# Patient Record
Sex: Male | Born: 1945 | Race: White | Hispanic: No | Smoking: Former smoker
Health system: Southern US, Community
[De-identification: ages and names within clinical notes are randomized; demographics above are authoritative.]

## PROBLEM LIST (undated history)

## (undated) DIAGNOSIS — F329 Major depressive disorder, single episode, unspecified: Secondary | ICD-10-CM

## (undated) DIAGNOSIS — M1990 Unspecified osteoarthritis, unspecified site: Secondary | ICD-10-CM

## (undated) DIAGNOSIS — F32A Depression, unspecified: Secondary | ICD-10-CM

## (undated) DIAGNOSIS — F419 Anxiety disorder, unspecified: Secondary | ICD-10-CM

## (undated) HISTORY — PX: TOTAL HIP ARTHROPLASTY: SHX124

## (undated) HISTORY — PX: JOINT REPLACEMENT: SHX530

## (undated) HISTORY — PX: OTHER SURGICAL HISTORY: SHX169

## (undated) HISTORY — PX: CORNEAL TRANSPLANT: SHX108

## (undated) HISTORY — PX: EYE SURGERY: SHX253

## (undated) HISTORY — PX: APPENDECTOMY: SHX54

## (undated) HISTORY — DX: Unspecified osteoarthritis, unspecified site: M19.90

---

## 1997-05-18 ENCOUNTER — Encounter: Admission: RE | Admit: 1997-05-18 | Discharge: 1997-05-18 | Payer: Self-pay | Admitting: Family Medicine

## 1997-06-13 ENCOUNTER — Encounter: Admission: RE | Admit: 1997-06-13 | Discharge: 1997-06-13 | Payer: Self-pay | Admitting: Family Medicine

## 1999-02-27 ENCOUNTER — Encounter: Admission: RE | Admit: 1999-02-27 | Discharge: 1999-05-28 | Payer: Self-pay | Admitting: Family Medicine

## 2001-10-01 ENCOUNTER — Ambulatory Visit (HOSPITAL_COMMUNITY): Admission: RE | Admit: 2001-10-01 | Discharge: 2001-10-01 | Payer: Self-pay | Admitting: Gastroenterology

## 2001-10-01 ENCOUNTER — Encounter (INDEPENDENT_AMBULATORY_CARE_PROVIDER_SITE_OTHER): Payer: Self-pay | Admitting: Specialist

## 2002-02-25 ENCOUNTER — Encounter: Payer: Self-pay | Admitting: Orthopedic Surgery

## 2002-03-07 ENCOUNTER — Encounter: Payer: Self-pay | Admitting: Orthopedic Surgery

## 2002-03-07 ENCOUNTER — Inpatient Hospital Stay (HOSPITAL_COMMUNITY): Admission: RE | Admit: 2002-03-07 | Discharge: 2002-03-10 | Payer: Self-pay | Admitting: Orthopedic Surgery

## 2005-05-20 ENCOUNTER — Encounter: Admission: RE | Admit: 2005-05-20 | Discharge: 2005-06-20 | Payer: Self-pay | Admitting: Neurosurgery

## 2006-09-28 ENCOUNTER — Inpatient Hospital Stay (HOSPITAL_COMMUNITY): Admission: RE | Admit: 2006-09-28 | Discharge: 2006-10-01 | Payer: Self-pay | Admitting: Orthopedic Surgery

## 2010-05-21 NOTE — Op Note (Signed)
NAMEGOTHAM, RADEN NO.:  [id number]   MEDICAL RECORD NO.:  [id number]          PATIENT TYPE:  INP   LOCATION:  X002                         FACILITY:  [facility]   PHYSICIAN:  Ollen Gross, M.D.    DATE OF BIRTH:  07/22/1945   DATE OF PROCEDURE:  09/28/2006  DATE OF DISCHARGE:                               OPERATIVE REPORT   PREOPERATIVE DIAGNOSIS:  Osteoarthritis, left hip.   POSTOPERATIVE DIAGNOSIS:  Osteoarthritis, left hip.   PROCEDURE:  Left total hip arthroplasty.   SURGEON:  Ollen Gross, M.D.   ASSISTANT:  Alexzandrew L. Perkins, P.A.C.   ANESTHESIA:  Spinal.   ESTIMATED BLOOD LOSS:  200.   DRAINS:  None.   COMPLICATIONS:  None.   CONDITION:  Stable to the recovery room.   BRIEF CLINICAL NOTE:  Carl Cook is a 65 year old male with severe end-stage  arthritis of the left hip with progressively worsening pain and  dysfunction.  He has had a successful right total hip arthroplasty and  presents now for left total hip arthroplasty.   PROCEDURE IN DETAIL:  After successful administration of spinal  anesthetic, the patient was placed in the right lateral decubitus  position with the left side up and held with the hip positioner.  The  left lower extremity was isolated from his perineum with plastic drapes  and prepped and draped in the usual sterile fashion.   A short posterolateral incision is made with a 10 blade through the  subcutaneous tissue to the level of the fascia lata which is incised in  line with the skin incision.  The sciatic nerve is palpated and  protected and the short external rotators isolated off the femur.  Capsulectomy is performed and the hip is dislocated.  The center of the  femoral head is marked and the trial prosthesis placed such that the  center of the trial head corresponds to the center of his native femoral  head.  Osteotomy line is marked on the femoral neck and osteotomy made  with an oscillating saw.  The femoral  head is removed and the femur  retracted anteriorly to gain acetabular exposure.   The femur is retracted anteriorly and acetabular retractors are placed.  The labrum and osteophytes are then removed.  Acetabular reaming starts  at 45 mm, coursing in increments of 2 to 55 mm, and a 56-mm Pinnacle  acetabular shell is placed in anatomic position and transfixed with two  dome screws.  The apex hole eliminator is placed, and the 40-mm neutral  Ultramet metal liner is placed.  This is a metal-on-metal hip  replacement.   The femur is prepared with the canal finder and irrigation.  Axial  reaming is performed up to 15.5 mg, proximal reaming to 20 D, and the  sleeve machined to a large.  20 D large trial sleeve is placed with a 20  x 15 stem and a 36 + 8 neck 10 degrees beyond his native anteversion.  A  40 + 0 trial head is placed, but this reduced too easily so we went to  40 +  6 which had more appropriate soft tissue tension.  He had great  stability with full extension, full external rotation, 70 degrees  flexion, 40 degrees adduction, 90 degrees internal rotation, 90 degrees  of flexion, 70 degrees of internal rotation.  By placing the left leg on  top of the right, the leg lengths felt equal.  The hip is then  dislocated and trials removed.  Permanent 20 D large sleeve is placed  with 20 x 15 stem, 36 + 8 neck 10 degrees beyond native anteversion.  40  + 6 head is placed and the hip is reduced with the same stability  parameters.  The wound is copiously irrigated with saline solution and  short rotators reattached to the femur through drill holes.  The fascia  lata is closed with interrupted #1 Vicryl, subcu closed with #1-0 and #2-  0 Vicryl and subcuticular running 4-0 Monocryl.  Incision is cleaned and  dried and Steri-Strips and bulky sterile dressing applied.   He is then awakened and transported to recovery in stable condition.      Ollen Gross, M.D.  Electronically  Signed     FA/MEDQ  D:  09/28/2006  T:  09/28/2006  Job:  [id number]

## 2010-05-21 NOTE — H&P (Signed)
NAMEKINGSON, LOHMEYER NO.:  [id number]   MEDICAL RECORD NO.:  [id number]          PATIENT TYPE:  INP   LOCATION:  1618                         FACILITY:  [facility]   PHYSICIAN:  Ollen Gross, M.D.    DATE OF BIRTH:  1945-12-30   DATE OF ADMISSION:  09/28/2006  DATE OF DISCHARGE:  10/01/2006                              HISTORY & PHYSICAL   DATE OF OFFICE VISIT HISTORY AND PHYSICAL:  September 15, 2006.   CHIEF COMPLAINT:  Left hip pain.   HISTORY OF PRESENT ILLNESS:  The patient is a 65 year old male who has  seen by Dr. Lequita Halt having previously undergone a right total hip and  doing well with that. He had continued problems with the left hip which  had been progressive in nature and had reached a point where he would  like to have the other side done.   ALLERGIES:  No known drug allergies.   CURRENT MEDICATIONS:  Allopurinol300 mg daily.  He stopped his baby  aspirin, glucosamine and fish oil preop.   PAST MEDICAL HISTORY:  Osteoarthritis and gout.   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   SOCIAL HISTORY:  Widower, works as a Advice worker, nonsmoker, two  drinks of alcohol, two children.  Mother will be assisting with care  after surgery.   FAMILY HISTORY:  Father deceased age 4 with a history of heart failure  and bypass. Mother with a history of breast cancer. Mother and brother  have had a hip replacement secondary to osteoarthritis.   REVIEW OF SYSTEMS:  GENERAL:  No fevers, chills or night sweats. NEURO:  No seizures, syncope or paralysis.  RESPIRATORY:  No shortness of  breath, productive cough or hemoptysis.  CARDIOVASCULAR:  No chest pain,  angina or orthopnea. GI: No nausea, vomiting, diarrhea or constipation.  GU:  No dysuria, hematuria or discharge.  MUSCULOSKELETAL:  Left hip.   PHYSICAL EXAMINATION:  VITAL SIGNS:  Pulse 64, respirations 14, blood  pressure 124/78.  GENERAL:  The patient is a 65 year old, white male, well-nourished, well-  developed in no acute distress.  Excellent historian.  HEENT: Normocephalic, atraumatic.  Pupils are round and reactive.  Oropharynx clear.  EOMs intact.  He does wear glasses.  Permanent  crowns, no dentures.  NECK:  Supple.  CHEST:  Clear anterior and posterior chest walls.  No rhonchi, rales or  wheezing.  HEART:  Regular rate and rhythm.  No murmur, S1, S2.  ABDOMEN:  Soft, nontender.  Bowel sounds present.  RECTAL/BREASTS/GENITALIA:  Not done not pertinent to illness.  EXTREMITIES:  Left hip:  The left hip shows flexion to 90 degrees,  zero  internal rotation, 10 degrees external rotation, 10 degrees abduction.   IMPRESSION:  Osteoarthritis left hip.   PLAN:  The patient admitted to [facility] to undergo a left  total hip replacement arthroplasty.  The surgery will be performed by  Dr. Ollen Gross.      Alexzandrew L. Julien Girt, P.A.C.  Ollen Gross, M.D.  Electronically Signed    ALP/MEDQ  D:  10/01/2006  T:  10/02/2006  Job:  [id number]   cc:   Evelena Peat, M.D.

## 2010-05-24 NOTE — Op Note (Signed)
NAME:  SHAHMEER, BUNN                          ACCOUNT NO.:  [id number]   MEDICAL RECORD NO.:  [id number]                   PATIENT TYPE:  INP   LOCATION:  0003                                 FACILITY:  [facility]   PHYSICIAN:  Ollen Gross, M.D.                 DATE OF BIRTH:  1945/01/12   DATE OF PROCEDURE:  03/07/2002  DATE OF DISCHARGE:                                 OPERATIVE REPORT   PREOPERATIVE DIAGNOSIS:  Osteoarthritis, right hip.   POSTOPERATIVE DIAGNOSIS:  Osteoarthritis, right hip.   PROCEDURE:  Right total hip arthroplasty.   SURGEON:  Ollen Gross, M.D.   ASSISTANT:  Alexzandrew L. Julien Girt, P.A.   ANESTHESIA:  Spinal.   ESTIMATED BLOOD LOSS:  150.   DRAINS:  Hemovac x1.   COMPLICATIONS:  None.   CONDITION:  Stable to recovery.   BRIEF CLINICAL NOTE:  The patient is a 65 year old male with severe end-  stage arthritis of the right hip with pain refractory to nonoperative  management.  He presents now for right total hip arthroplasty.   DESCRIPTION OF PROCEDURE:  After the successful administration of a spinal  anesthetic, the patient is placed in the left lateral decubitus position  with the right side up and held with the hip positioner.  The right lower  extremity is isolated from his perineum with plastic drapes and prepped and  draped in the usual sterile fashion.  A mini-posterolateral incision is made  with a 10 blade through subcutaneous tissue to the level of the fascia lata,  which was incised in line with the skin incision.  The sciatic nerve was  palpated and protected and short external rotators isolated off the femur.  Capsulectomy is performed and the hip dislocated.  The center of the femoral  head is marked and the trial prosthesis is placed such that the center of  the trial head corresponds to the center of the native femoral head.  The  osteotomy line is marked on the femoral neck and osteotomy is made with an  oscillating saw.  The  femur is then retracted anteriorly and acetabular  exposure obtained.  We removed the acetabular labrum and then started  reaming at a 47, coursing in increments of two to a 55, and then a 56 mm  Pinnacle acetabular shell is placed in anatomic position and transfixed with  two domed screws.  A trial 36 mm neutral liner is placed.   The femur is prepared first with the canal finder and then irrigation.  Axial reaming is performed to 15.5 mm proximal, reaming to a 70F, and the  sleeve machined to a large.  A 70F large sleeve is placed and a 20 x 15 stem  and a 36 +8 neck.  His anteversion is essentially neutral, so we built in an  additional 10 degrees of anteversion.  The 36 +0 head trial is placed.  The  hip is reduced with outstanding stability, full extension, full external  rotation, 70 degrees flexion, 40 degrees abduction in 90 degrees internal  rotation; and 90 degrees flexion in 70 degrees internal rotation.  All the  trials are then removed and then the apex hole eliminator placed into the  acetabular shell.  A 36 mm neutral Ultramet metal liner is then gently  impacted into the acetabular shell.  The femur is then prepared with the 71F  large permanent sleeve and a 20 x 15 stem with a 36 +8 neck.  Again we  anteverted him 10 degrees beyond his native version.  The permanent 36 +0  head is placed, the hip reduced, and the same stability parameters.  The  wound is copiously irrigated with antibiotic solution and the short rotators  reattached to the femur through drill holes.  The fascia lata is then closed  over a Hemovac drain with interrupted #1 Vicryl, subcu closed with  interrupted 2-0 Vicryl and subcuticular running 4-0 Monocryl.  The incision  is cleaned and dried and Steri-Strips and a bulky sterile dressing applied.  He is then placed into a knee immobilizer, awakened, and transported to  recovery in stable condition.                                               Ollen Gross, M.D.    FA/MEDQ  D:  03/07/2002  T:  03/07/2002  Job:  [id number]

## 2010-05-24 NOTE — Discharge Summary (Signed)
NAME:  Carl [name], BURGO                          ACCOUNT NO.:  [id number]   MEDICAL RECORD NO.:  [id number]                   PATIENT TYPE:  INP   LOCATION:  0460                                 FACILITY:  [facility]   PHYSICIAN:  Ollen Gross, M.D.                 DATE OF BIRTH:  01/01/1946   DATE OF ADMISSION:  03/07/2002  DATE OF DISCHARGE:  03/10/2002                                 DISCHARGE SUMMARY   ADMISSION DIAGNOSES:  1. Right hip osteoarthritis.  2. Gout.   DISCHARGE DIAGNOSES:  1. Osteoarthritis, right hip, status post right total hip replacement     arthroplasty.  2. Gout.  3. Mild postoperative hyponatremia.   PROCEDURE:  The patient was taken to the operating room on March 07, 2002,  and underwent a right total hip arthroplasty.  The surgeon was Dr. Ollen Gross and the assistant was Alexzandrew L. Perkins, P.A.-C.  Spinal  anesthesia.  The blood loss was 150 mL.  A Hemovac drain x1.   CONSULTATIONS:  None.   HISTORY OF PRESENT ILLNESS:  The patient is a 65 year old male who has been  seen by Dr. Lequita Halt for ongoing right hip pain.  He has known end-stage  osteoarthritis.  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  He has reached a  point where he is physically and mentally ready  to proceed with surgery.  The risks and benefits were discussed.  The procedure had been discussed at  length with him, and he has elected to proceed with surgery.   LABORATORY DATA:  CBC on admission:  Hemoglobin 15.1, hematocrit 43.4, white  blood cell count 5.6, red blood cell count 4.58.  Postoperative hemoglobin  13.6, hematocrit 38.3.  Last H&H were 12.6 and 35.1.  The differential on  this admission:  CBC within normal limits.  The PT and PTT on admission 12.4  and  28.0 respectively.  The INR was 0.9.  Serial pro-times were followed.  The last noted PT and INR were 14.4 and 1.1.  The K on admission showed a  minimally low potassium of 3.4.  The potassium came back up at 4.0.  The  glucose was minimally elevated at 135 and remained at 135.  The remaining  chemistry panel all within normal limits.  A follow-up BMET showed a sodium  drop from 140 to 133.  It was stable at 133.  A urinalysis on admission was  negative.  Her blood type was O-positive.  An electrocardiogram dated February 25, 2002, revealed normal sinus rhythm  with occasional premature supraventricular complexes.  No tracings to  compare.  Confirmed by Dr. [place] Bing.  A chest x-ray preoperatively dated February 25, 2002, revealed scarring in  the right upper lobe.  No active disease.  Right hip film showed marked osteoarthritis of the right hip, along with the  pelvis showing marked osteoarthritis of the right hip.  The pelvis and right hip films postoperatively dated March 07, 2002, revealed  satisfactory appearance of the right total hip replacement arthroplasty.   HOSPITAL COURSE:  The patient was admitted to [facility] on March 07, 2002, and taken to the operating room and underwent the above-stated  procedure without complications.  The patient tolerated the procedure well  and was given 24 hours of postoperative IV antibiotics.  Placed on Coumadin  for deep vein thrombosis prophylaxis.  The patient was placed on PCA  analgesia for pain control.  A Hemovac drain placed at the time of surgery  was pulled on postoperative day one.  The patient had a slight temperature  of 101.3 degrees on postoperative day one, treated with antibiotics and  incentive spirometer.  Encouraged mobility.  He did extremely well with  physical therapy, up ambulating already 250 feet on postoperative day one,  and greater than 300 feet by postoperative day three.  A dressing change  initially  on postoperative day two.  The incision was healing well.  The PCA  was discontinued.  The Foley was out.  The IV was discontinued.  The patient  was doing extremely well with pain control, very well with his physical  therapy.   DISPOSITION:  By day three postoperatively he was discharged home, on March 10, 2002.   DISCHARGE MEDICATIONS:  1. Percocet for pain.  2. Robaxin for spasm.  3. Coumadin for deep vein thrombosis prophylaxis.   DIET:  As tolerated.   FOLLOW UP:  Follow up two weeks from surgery.  Call the office for an  appointment at #[phone].   ACTIVITY/INSTRUCTIONS:  Touch-down weightbearing.  Hip precautions.  Home  health PT, home health nursing through [facility].  May start  showering.   CONDITION ON DISCHARGE:  Improved.       Alexzandrew L. Julien Girt, P.A.              Ollen Gross, M.D.    ALP/MEDQ  D:  03/21/2002  T:  03/21/2002  Job:  [id number]

## 2010-05-24 NOTE — Discharge Summary (Signed)
NAMEJUVENCIO, Carl [name] NO.:  [id number]   MEDICAL RECORD NO.:  [id number]          PATIENT TYPE:  INP   LOCATION:  1618                         FACILITY:  [facility]   PHYSICIAN:  Ollen Gross, M.D.    DATE OF BIRTH:  08/12/1945   DATE OF ADMISSION:  09/28/2006  DATE OF DISCHARGE:  10/01/2006                               DISCHARGE SUMMARY   DISCHARGE DIAGNOSIS:  1. Osteoarthritis, left hip.  2. Gout.   DISCHARGE DIAGNOSES:  1. Osteoarthritis left hip, status post left total hip replacement      arthroplasty.  2. Gout.   PROCEDURE:  September 28, 2006, left total hip.  Surgeon: Dr. Lequita Halt.  Assistant: Avel Peace PA-C.  Spinal anesthesia.   CONSULTATIONS:  None.   BRIEF HISTORY:  This is a 65 year old male with significant and severe  end-stage arthritis of left hip with progressive worsening pain and  dysfunction. Underwent successful right total hip, now presents for left  total hip   LABORATORY DATA:  Preop CBC showed hemoglobin 15.5, hematocrit of 43.9,  white cell count 6.4. Postop hemoglobin 12.8 drifted down to 12.1.  Last  hemoglobin 11.90, hematocrit 33.5.  PT and PTT preop 13.1 and 27,  respectively.  INR 1.0.  Serial pro times followed.  Last PT  17.4, INR  1.4. Chemistry panel on admission all within normal limits.  Serum  electrolytes remained within normal limits.  Preop UA negative.  Blood  group type O+.   Portable pelvis and hip x-rays on September 28, 2006: Left total hip a  good position and alignment.   Preop hip films September 21, 2006: Advanced left hip arthritis.   EKG dated September 04, 2006:  Sinus rhythm, low QRS voltage in precordial  leads.   HOSPITAL COURSE:  The patient was admitted to [facility].  Tolerated procedure well, later transferred to the recovery room and  then to the orthopedic floor. Started on PCA and p.o. analgesic pain  control following surgery. Actually doing very well on morning of day  #1.   Did have some pain the night of surgery, but by the next morning  most had localized to incisional pain.  He denied any groin pain. Low-  grade temperature. Encouraged incentive spirometer. Had excellent  output. Discontinued his PCA after lunch.  Tried to get him out of bed  with PT, progressed well.   By day #2, he was doing fantastic. Hemoglobin was stable to 12.  Incision looked good.  Continued to work with PT, and by day #3,  October 01, 2006, he was walking well, tolerating medications and was  discharged home.   DISCHARGE/PLAN:  1. The patient is discharged home on October 01, 2006.  2. For Discharge diagnoses, please see above.  3. Discharge medications:  Percocet, Robaxin, Coumadin.  4. Activities:  Partial weightbearing 25-50% to left lower extremity.  5. Home health PT and home health nursing.  6. Total hip protocol.  7. Hip precautions  8. Followup in 2 weeks.   DISPOSITION:  Home.   CONDITION ON DISCHARGE:  Improving.  Alexzandrew L. Perkins, P.A.C.      Ollen Gross, M.D.  Electronically Signed    ALP/MEDQ  D:  10/27/2006  T:  10/27/2006  Job:  [id number]   cc:   Evelena Peat, M.D.

## 2010-05-24 NOTE — H&P (Signed)
NAME:  Carl [name], Carl [name]                          ACCOUNT NO.:  [id number]   MEDICAL RECORD NO.:  [id number]                   PATIENT TYPE:   LOCATION:                                       FACILITY:   PHYSICIAN:  Ollen Gross, M.D.                 DATE OF BIRTH:  1945/03/12   DATE OF ADMISSION:  03/07/2002  DATE OF DISCHARGE:                                HISTORY & PHYSICAL   CHIEF COMPLAINT:  Right hip pain.   HISTORY OF PRESENT ILLNESS:  The patient is a 65 year-old male who comes in  and has been seen by Dr. Lequita Halt for ongoing right hip pain.  He has been  seen in the office and found to have end stage osteoarthritis.  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 However, the pain has been progressive into the right  hip and has started to interfere with his mobility and active lifestyle.  He  has reached a point where he is physically and mentally ready to pursue  surgery.  The risks and benefits of the surgery have been discussed with the  patient at length and he has elected to proceed with surgery.   ALLERGIES:  No known drug allergies.   CURRENT MEDICATIONS:  Allopurinol 300 mg daily.   PAST MEDICAL HISTORY:  1. Gout.  2. Osteoarthritis of the hip.   PAST SURGICAL HISTORY:  1. Appendectomy in 1962.  2. Corneal transplant on the right in 1980.   SOCIAL HISTORY:  He is married and works as a Doctor, general practice at [employer]. He is a non-smoker and only has about two drinks of  alcohol a day. He has two children. His wife and mother will be assisting  him with care after surgery.  He lives in a split level home with five steps  entering to the front door and seven steps up or down to the upper and lower  levels.   FAMILY HISTORY:  Father deceased at age 60 with a history of heart failure  and bypass.  Mother with a history of breast cancer.  Both his mother and  one of his brothers  have had hip replacement.   REVIEW OF SYMPTOMS:  GENERAL:  No fevers, chills or night sweats.  NEUROLOGIC:  No seizures, syncope or paralysis.  RESPIRATORY:  No shortness  of breath, productive cough or hemoptysis.  CARDIOVASCULAR:  No chest pain,  angina or orthopnea.  GI:  No nausea, vomiting, diarrhea or constipation.  GU:  No dysuria, hematuria or discharge. MUSCULOSKELETAL:  Pertinent for the  right hip found in the history of present illness.   PHYSICAL EXAMINATION:  VITAL SIGNS:  Pulse 60, respirations 16, blood  pressure 142/86.  GENERAL:  The patient is a 65 year-old white male who is well developed,  well nourished and  appears to be in no acute distress.  He is alert,  oriented, cooperative and appears to be an excellent historian.  Very  pleasant tone on exam.  HEENT:  Normocephalic, atraumatic.  Pupils are round and reactive.  Oropharynx is clear.  Extraocular movements are intact.  The neck is supple.  No carotid bruits are appreciated.  CHEST:  Clear to auscultation anterior and posterior chest walls.  No  rhonchi or rales.  HEART:  Regular rhythm, split S1, normal S2.  No rubs, palpitations or  murmurs are appreciated.  ABDOMEN:  Soft and non-tender, bowel sounds are present.  RECTAL, BREAST AND GENITALIA:  Not done and not pertinent to the present  illness.  EXTREMITIES:  Right lower extremity:  He has hip flexion of about 90  degrees, no internal rotation at all, only about 10 degrees of external  rotation, only about 5 degrees of abduction on the right hip.  The left hip  has excellent motion in comparison.   IMPRESSION:  1. Right hip osteoarthritis.  2. Gout.   PLAN:  The patient will be admitted to [facility] to undergo a  right total hip replacement arthroplasty.  The surgery is to be performed by  Dr. Lequita Halt.  The patient's medical doctor is Dr. Dara Hoyer.  Dr. Arlyce Dice  will be notified of the room number and admission and will be consulted if   needed for any medical assistance for this patient throughout the hospital  course.     Alexzandrew L. Julien Girt, P.A.              Ollen Gross, M.D.    ALP/MEDQ  D:  03/02/2002  T:  03/02/2002  Job:  [id number]   cc:   Teena Irani. Arlyce Dice, M.D.  [address]  [place]  [place] [address]  Fax: [phone]

## 2010-05-24 NOTE — Op Note (Signed)
   [name], [name] NO.:  [id number]   MEDICAL RECORD NO.:  [id number]                   PATIENT TYPE:  AMB   LOCATION:  ENDO                                 FACILITY:  [facility]   PHYSICIAN:  Petra Kuba, M.D.                 DATE OF BIRTH:  December 11, 1945   DATE OF PROCEDURE:  10/01/2001  DATE OF DISCHARGE:                                 OPERATIVE REPORT   PROCEDURE:  Colonoscopy with biopsy.   INDICATIONS:  Screening.  Consent was signed after risks, benefits, methods  and options were thoroughly discussed in the office.   MEDICATIONS USED:  1. Demerol 60 mg .  2. Versed 6 mg.   DESCRIPTION OF PROCEDURE:  Rectal inspection is pertinent for small external  hemorrhoids.  Digital examination was negative.  Pediatric video adjustable  colonoscope was inserted, fairly easily advanced around the colon to the  cecum.  This did require rolling him on his back and some abdominal  pressure.  No obvious abnormality was seen on insertion.  The cecum was  identified by the appendiceal orifice and the ileocecal valve.  The scope  was inserted a short way in the terminal ileum, which was normal.  Photo  documentation was obtained.  The prep was adequate; there was some liquid  stool that required washing and suctioning on slow withdrawal through the  colon.  The cecum, ascending, transverse, descending and majority of the  sigmoid was normal.  In the distal sigmoid and rectum a few tiny  hyperplastic-appearing polyps were seen, and were cold biopsied and removed.  The scope was retroflexed back into the rectum, noting some internal  hemorrhoids.  The scope was straightened and readvanced a short way up the  left side of the colon.  Air was suctioned and scope removed.  The patient  tolerated the procedure well, there were no obvious immediate complication.   ENDOSCOPIC DIAGNOSES:  1. Internal/external hemorrhoids.  2. A few hyperplastic-appearing rectal and  distal sigmoid polyps, cold     biopsied.  3. Otherwise within normal limits to the terminal ileum.   PLAN:  Await pathology.  Will probably recheck colon screening in five to  ten years.  Have him seen back p.r.n., otherwise return to the care of Dr.  Arlyce [name] for the customary health care management, to include yearly rectals  and guaiacs.                                               Petra Kuba, M.D.    MEM/MEDQ  D:  10/01/2001  T:  10/02/2001  Job:  [phone]   cc:   Carl [name]. Carl [name], M.D.

## 2010-10-10 ENCOUNTER — Emergency Department (HOSPITAL_COMMUNITY): Payer: 59

## 2010-10-10 ENCOUNTER — Emergency Department (HOSPITAL_COMMUNITY)
Admission: EM | Admit: 2010-10-10 | Discharge: 2010-10-10 | Disposition: A | Discharge status: Home / Self Care | Payer: 59 | Attending: Emergency Medicine | Admitting: Emergency Medicine

## 2010-10-10 DIAGNOSIS — Z7982 Long term (current) use of aspirin: Secondary | ICD-10-CM | POA: Insufficient documentation

## 2010-10-10 DIAGNOSIS — Z96649 Presence of unspecified artificial hip joint: Secondary | ICD-10-CM | POA: Insufficient documentation

## 2010-10-10 DIAGNOSIS — M25559 Pain in unspecified hip: Secondary | ICD-10-CM | POA: Insufficient documentation

## 2010-10-10 DIAGNOSIS — R030 Elevated blood-pressure reading, without diagnosis of hypertension: Secondary | ICD-10-CM | POA: Insufficient documentation

## 2010-10-10 DIAGNOSIS — T84029A Dislocation of unspecified internal joint prosthesis, initial encounter: Secondary | ICD-10-CM | POA: Insufficient documentation

## 2010-10-10 DIAGNOSIS — Z79899 Other long term (current) drug therapy: Secondary | ICD-10-CM | POA: Insufficient documentation

## 2010-10-10 DIAGNOSIS — W010XXA Fall on same level from slipping, tripping and stumbling without subsequent striking against object, initial encounter: Secondary | ICD-10-CM | POA: Insufficient documentation

## 2010-10-10 LAB — DIFFERENTIAL
Basophils Absolute: 0.1 10*3/uL (ref 0.0–0.1)
Basophils Relative: 2 % — ABNORMAL HIGH (ref 0–1)
Lymphocytes Relative: 28 % (ref 12–46)
Monocytes Absolute: 0.5 10*3/uL (ref 0.1–1.0)
Neutro Abs: 3.3 10*3/uL (ref 1.7–7.7)
Neutrophils Relative %: 57 % (ref 43–77)

## 2010-10-10 LAB — CBC
Hemoglobin: 16.1 g/dL (ref 13.0–17.0)
MCHC: 35.5 g/dL (ref 30.0–36.0)
WBC: 5.7 10*3/uL (ref 4.0–10.5)

## 2010-10-10 LAB — BASIC METABOLIC PANEL
Chloride: 103 mEq/L (ref 96–112)
GFR calc Af Amer: >90 mL/min (ref >90)
GFR calc non Af Amer: >90 mL/min (ref >90)
Glucose, Bld: 108 mg/dL — ABNORMAL HIGH (ref 70–99)
Potassium: 4 mEq/L (ref 3.5–5.1)
Sodium: 139 mEq/L (ref 135–145)

## 2010-10-17 LAB — CBC
HCT: 33.9 — ABNORMAL LOW
Hemoglobin: 11.9 — ABNORMAL LOW
Hemoglobin: 12.1 — ABNORMAL LOW
Hemoglobin: 12.8 — ABNORMAL LOW
MCHC: 35.4
MCHC: 35.5
MCV: 92.4
MCV: 92.4
RBC: 3.63 — ABNORMAL LOW
RBC: 3.67 — ABNORMAL LOW
RBC: 3.92 — ABNORMAL LOW
RBC: 4.75
WBC: 6.4
WBC: 9.2

## 2010-10-17 LAB — COMPREHENSIVE METABOLIC PANEL
ALT: 22
AST: 22
Alkaline Phosphatase: 93
CO2: 26
Chloride: 105
GFR calc Af Amer: >60
GFR calc non Af Amer: >60
Potassium: 4
Sodium: 141
Total Bilirubin: 0.8

## 2010-10-17 LAB — BASIC METABOLIC PANEL
BUN: 10
CO2: 29
Calcium: 8.7
Chloride: 106
GFR calc Af Amer: >60
GFR calc Af Amer: >60
Potassium: 3.7
Sodium: 136

## 2010-10-17 LAB — PROTIME-INR: INR: 1.2

## 2010-10-17 LAB — CROSSMATCH: ABO/RH(D): O POS

## 2010-10-17 LAB — URINALYSIS, ROUTINE W REFLEX MICROSCOPIC
Nitrite: NEGATIVE
Specific Gravity, Urine: 1.018
Urobilinogen, UA: 0.2
pH: 6

## 2011-01-20 ENCOUNTER — Ambulatory Visit (INDEPENDENT_AMBULATORY_CARE_PROVIDER_SITE_OTHER): Payer: 59 | Admitting: Internal Medicine

## 2011-01-20 DIAGNOSIS — N508 Other specified disorders of male genital organs: Secondary | ICD-10-CM

## 2011-01-20 DIAGNOSIS — Z719 Counseling, unspecified: Secondary | ICD-10-CM

## 2011-01-20 DIAGNOSIS — G909 Disorder of the autonomic nervous system, unspecified: Secondary | ICD-10-CM

## 2011-02-19 ENCOUNTER — Other Ambulatory Visit: Payer: Self-pay | Admitting: Internal Medicine

## 2011-02-19 DIAGNOSIS — G902 Horner's syndrome: Secondary | ICD-10-CM

## 2011-02-27 ENCOUNTER — Other Ambulatory Visit: Payer: Self-pay | Admitting: Internal Medicine

## 2011-02-27 DIAGNOSIS — G902 Horner's syndrome: Secondary | ICD-10-CM

## 2011-03-05 ENCOUNTER — Ambulatory Visit
Admission: RE | Admit: 2011-03-05 | Discharge: 2011-03-05 | Disposition: A | Discharge status: Home / Self Care | Payer: 59 | Source: Ambulatory Visit | Attending: Internal Medicine | Admitting: Internal Medicine

## 2011-03-05 DIAGNOSIS — G902 Horner's syndrome: Secondary | ICD-10-CM

## 2011-03-05 MED ORDER — GADOBENATE DIMEGLUMINE 529 MG/ML IV SOLN
17.0000 mL | Freq: Once | INTRAVENOUS | Status: AC | PRN
  Administered 2011-03-05: 17 mL via INTRAVENOUS

## 2011-03-10 ENCOUNTER — Encounter: Payer: Self-pay | Admitting: Internal Medicine

## 2011-04-04 ENCOUNTER — Telehealth: Payer: Self-pay

## 2011-04-04 NOTE — Telephone Encounter (Signed)
Will hold for Dr. Perrin Maltese.  Carl Cook

## 2011-04-04 NOTE — Telephone Encounter (Signed)
Received call from patient requesting results from MRI last month.  He received letter from [place] to share results with his optho MD, but he never was told the results.  I LMOM advising patient that (per Chelle) it did not show anything acute, but that we would get Dr. Leodis Liverpool review of this and call him back.  Note to MD.

## 2011-08-26 ENCOUNTER — Other Ambulatory Visit: Payer: Self-pay | Admitting: Internal Medicine

## 2011-08-26 NOTE — Telephone Encounter (Signed)
Patient had CPE 10/2010-- ok for #90 0 refill?

## 2011-11-08 ENCOUNTER — Other Ambulatory Visit: Payer: Self-pay | Admitting: Internal Medicine

## 2011-11-09 ENCOUNTER — Other Ambulatory Visit: Payer: Self-pay | Admitting: Internal Medicine

## 2011-11-09 NOTE — Telephone Encounter (Signed)
Patients chart is at the nurses station in the pa pool pile.  [facility] [id number]

## 2011-11-12 NOTE — Progress Notes (Signed)
Completed prior auth and faxed in form, and also add'l questions [employer] had were faxed back on 11/11/11. Received approval through 11/09/12 and faxed approval notice to pharmacy.

## 2011-11-22 ENCOUNTER — Other Ambulatory Visit: Payer: Self-pay | Admitting: Internal Medicine

## 2011-12-22 ENCOUNTER — Other Ambulatory Visit: Payer: Self-pay | Admitting: Physician Assistant

## 2011-12-22 NOTE — Telephone Encounter (Signed)
Please pull paper chart.  

## 2011-12-22 NOTE — Telephone Encounter (Signed)
Paper chart pulled at nurses station pa pool [phone]

## 2012-02-03 ENCOUNTER — Encounter: Payer: Self-pay | Admitting: Family Medicine

## 2012-02-03 ENCOUNTER — Ambulatory Visit (INDEPENDENT_AMBULATORY_CARE_PROVIDER_SITE_OTHER): Payer: Medicare Other | Admitting: Family Medicine

## 2012-02-03 VITALS — BP 140/78 | HR 71 | Temp 98.1°F | Resp 16 | Ht 71.0 in | Wt 178.0 lb

## 2012-02-03 DIAGNOSIS — E789 Disorder of lipoprotein metabolism, unspecified: Secondary | ICD-10-CM

## 2012-02-03 DIAGNOSIS — M109 Gout, unspecified: Secondary | ICD-10-CM | POA: Insufficient documentation

## 2012-02-03 DIAGNOSIS — Z760 Encounter for issue of repeat prescription: Secondary | ICD-10-CM

## 2012-02-03 MED ORDER — ALLOPURINOL 300 MG PO TABS: 300.0000 mg | ORAL_TABLET | Freq: Every day | ORAL | Status: DC

## 2012-02-03 MED ORDER — OMEGA-3-ACID ETHYL ESTERS 1 G PO CAPS: ORAL_CAPSULE | ORAL | Status: DC

## 2012-02-03 NOTE — Patient Instructions (Signed)
Your next visit will be for Medicare physical exam covered in your 1st year on Medicare. We will do fasting labs at that visit.

## 2012-02-03 NOTE — Progress Notes (Signed)
S:  This 67 y.o. Cauc male has a lipid disorder (mainly elevated triglycerides) and gout. He has been on current medications for several years. He denies any medication side effects. Last labs were checked over 1 year ago. He is scheduled for CPE in April 2014 but would like to reschedule that; he plans to be out of state the months of March and April. He is retired and his children reside in [place] and [place].  ROS: Negative for fatigue, unexplained weight change, fever/chills, CP or tightness, palpitations, SOB or DOE, joint pain or swelling, myalgias, rash or pallor, GI upset or HA.  O:  Filed Vitals:   02/03/12 1510  BP: 140/78  Pulse: 71  Temp: 98.1 F (36.7 C)  Resp: 16   GNE: In NAD; WN,WD. HEENT: [place]/AT; EOMI w/ clear conj and sclerae; EACs normal. Oroph clear and moist. COR: RRR. LUNGS: Normal resp rate and effort. MS: MAEs; no deformities.  NEURO: A&O x 3; CNs intact. Nonfocal.  A/P: 1. Lipid disorder     RF: Lovaza (generic)   4 capsules once a day.  2. Medication refill     RF: Allopurinol  300 mg   1 tab daily.   CPE and fasting labs at next visit.

## 2012-02-23 ENCOUNTER — Encounter: Payer: 59 | Admitting: Internal Medicine

## 2012-02-26 ENCOUNTER — Ambulatory Visit (INDEPENDENT_AMBULATORY_CARE_PROVIDER_SITE_OTHER): Payer: Medicare Other | Admitting: Family Medicine

## 2012-02-26 ENCOUNTER — Encounter: Payer: Self-pay | Admitting: Family Medicine

## 2012-02-26 VITALS — BP 120/84 | HR 93 | Temp 98.3°F | Resp 16 | Ht 70.75 in | Wt 173.6 lb

## 2012-02-26 DIAGNOSIS — Z0000 Encounter for general adult medical examination without abnormal findings: Secondary | ICD-10-CM

## 2012-02-26 DIAGNOSIS — Z125 Encounter for screening for malignant neoplasm of prostate: Secondary | ICD-10-CM

## 2012-02-26 LAB — LIPID PANEL
Cholesterol: 186 mg/dL (ref 0–200)
Total CHOL/HDL Ratio: 3.6 Ratio
Triglycerides: 117 mg/dL (ref <150)
VLDL: 23 mg/dL (ref 0–40)

## 2012-02-26 LAB — IFOBT (OCCULT BLOOD): IFOBT: NEGATIVE

## 2012-02-26 LAB — COMPREHENSIVE METABOLIC PANEL
CO2: 24 mEq/L (ref 19–32)
Creat: 0.77 mg/dL (ref 0.50–1.35)
Glucose, Bld: 103 mg/dL — ABNORMAL HIGH (ref 70–99)
Total Bilirubin: 0.9 mg/dL (ref 0.3–1.2)

## 2012-02-26 NOTE — Progress Notes (Signed)
  Subjective:    Patient ID: Carl Cook, male    DOB: 02-09-1945, 67 y.o.   MRN: [id number]  HPI  This 67 y.o. Cauc male is here for [facility]. He has hx of elevated TGs; he takes  Lovaza. He also has gout which is well controlled on Allopurinol. He has no flare up to report.  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  He recently sought counslling for anxiety and plans to return for follow-up visits.   All HCM is UTD; he needs RX for Zostavax.    Review of Systems  Constitutional: Negative.   HENT: Negative.   Eyes: Negative.   Respiratory: Negative.   Cardiovascular: Negative.   Gastrointestinal: Negative.   Endocrine: Negative.   Genitourinary: Negative.   Allergic/Immunologic: Negative.   Neurological: Negative.   Hematological: Negative.   Psychiatric/Behavioral: Negative.        Objective:   Physical Exam  Nursing note and vitals reviewed. Constitutional: Vital signs are normal. He appears well-developed and well-nourished. No distress.  HENT:  Head: Normocephalic and atraumatic.  Right Ear: Hearing, tympanic membrane, external ear and ear canal normal.  Left Ear: Hearing, tympanic membrane, external ear and ear canal normal.  Nose: Nose normal. No nasal deformity or septal deviation.  Mouth/Throat: Uvula is midline, oropharynx is clear and moist and mucous membranes are normal. No oral lesions. Normal dentition.  Genitourinary: Rectal exam shows external hemorrhoid. Rectal exam shows no fissure, no mass, no tenderness and anal tone normal. Guaiac negative stool. Prostate is enlarged. Prostate is not tender.  R lobe of gland > L lobe; firm and slightly nodular  Lymphadenopathy:       Right: No inguinal adenopathy present.       Left: No inguinal adenopathy present.  Neurological: He is alert.      Results for orders placed in visit on 02/26/12  IFOBT (OCCULT BLOOD)      Result Value Range   IFOBT Negative      ECG: NSR; no  ST-TW changes or ectopy.     Assessment & Plan:  Routine general medical examination at a health care facility - Plan: EKG 12-Lead  Hypertrophy of prostate without urinary obstruction and other lower urinary tract symptoms (LUTS) - Monitor; further action if PSA elevated.  Special screening for malignant neoplasm of prostate - Plan: PSA, Medicare  Screening for unspecified condition - Plan: IFOBT POC (occult bld, rslt in office)  Screening for lipoid disorders - Plan: Lipid panel

## 2012-02-26 NOTE — Patient Instructions (Signed)
Keeping you healthy  Get these tests  Blood pressure- Have your blood pressure checked once a year by your healthcare provider.  Normal blood pressure is 120/80  Weight- Have your body mass index (BMI) calculated to screen for obesity.  BMI is a measure of body fat based on height and weight. You can also calculate your own BMI at [email or web address].  Cholesterol- Have your cholesterol checked every year.  Diabetes- Have your blood sugar checked regularly if you have high blood pressure, high cholesterol, have a family history of diabetes or if you are overweight.  Screening for Colon Cancer- Colonoscopy starting at age 66.  Screening may begin sooner depending on your family history and other health conditions. Follow up colonoscopy as directed by your Gastroenterologist.  Screening for Prostate Cancer- Both blood work (PSA) and a rectal exam help screen for Prostate Cancer.  Screening begins at age 66 with African-American men and at age 64 with Caucasian men.  Screening may begin sooner depending on your family history. Your prostate gland felt enlarged and firm on exam today; any further evaluation will depend on lab (PSA) result.  Take these medicines  Aspirin- One aspirin daily can help prevent Heart disease and Stroke.  Flu shot- Every fall.  Tetanus- Every 10 years. You had Tdap in 2007; next Tetanus is due in 67.  Zostavax- Once after the age of 66 to prevent Shingles. You have been given a prescription to get this vaccine at a local pharmacy.  Pneumonia shot- Once after the age of 66; if you are younger than 62, ask your healthcare provider if you need a Pneumonia shot. You had this vaccine in 2011.  Take these steps  Don't smoke- If you do smoke, talk to your doctor about quitting.  For tips on how to quit, go to www.smokefree.gov or call 1-800-QUIT-NOW.  Be physically active- Exercise 5 days a week for at least 30 minutes.  If you are not already physically active  start slow and gradually work up to 30 minutes of moderate physical activity.  Examples of moderate activity include walking briskly, mowing the yard, dancing, swimming, bicycling, etc.  Eat a healthy diet- Eat a variety of healthy food such as fruits, vegetables, low fat milk, low fat cheese, yogurt, lean meant, poultry, fish, beans, tofu, etc. For more information go to www.thenutritionsource.org  Drink alcohol in moderation- Limit alcohol intake to less than two drinks a day. Never drink and drive.  Dentist- Brush and floss twice daily; visit your dentist twice a year.  Depression- Your emotional health is as important as your physical health. If you're feeling down, or losing interest in things you would normally enjoy please talk to your healthcare provider.  Eye exam- Visit your eye doctor every year.  Safe sex- If you may be exposed to a sexually transmitted infection, use a condom.  Seat belts- Seat belts can save your life; always wear one.  Smoke/Carbon Monoxide detectors- These detectors need to be installed on the appropriate level of your home.  Replace batteries at least once a year.  Skin cancer- When out in the sun, cover up and use sunscreen 15 SPF or higher.  Violence- If anyone is threatening you, please tell your healthcare provider.  Living Will/ Health care power of attorney- Speak with your healthcare provider and family.    Benign Prostatic Hypertrophy  The prostate gland is part of the reproductive system of men. A normal prostate is about the size and shape of a  walnut. The prostate gland makes a fluid that is mixed with sperm to make semen. This gland surrounds the urethra and is located in front of the rectum and just below the bladder. The bladder is where urine is stored. The urethra is the tube through which urine passes from the bladder to get out of the body. The prostate grows as a man ages. An enlarged prostate not caused by cancer is called benign  prostatic hypertrophy (BPH). This is a common health problem in men over age 59. This condition is a normal part of aging. An enlarged prostate presses on the urethra. This makes it harder to pass urine. In the early stages of enlargement, the bladder can get by with a narrowed urethra by forcing the urine through. If the problem gets worse, medical or surgical treatment may be required.  This condition should be followed by your caregiver. Longstanding back pressure on the kidneys can cause infection. Back pressure and infection can progress to bladder damage and kidney (renal) failure. If needed, your caregiver may refer you to a specialist in kidney and prostate disease (urologist). CAUSES  The exact cause is not known.  SYMPTOMS   You are not able to completely empty your bladder.  Getting up often during the night to urinate.  Need to urinate frequently during the day.  Difficultly in starting urine flow.  Decrease in size and strength of the urine stream.  Dribbling after urination.  Pain on urination (more common with infection).  Inability to pass your water. This needs immediate treatment. DIAGNOSIS  These tests will help your caregiver understand your problem:  Digital rectal exam (DRE). In a rectal exam, your caregiver checks your prostate by putting a gloved, lubricated finger into the rectum to feel the back of your prostate gland. This exam detects the size of the gland and abnormal lumps or growths.  Urinalysis (exam of the urine). This may include a culture if there is concern about infection.  Prostate Specific Antigen (PSA). This is a blood test used to screen for prostate cancer. It is not used alone for diagnosing prostate cancer.  Rectal ultrasound (sonogram). This test uses sound waves to electronically produce a "picture" of the prostate. It helps examine the prostate gland for cancer. TREATMENT  Mild symptoms may not need treatment. Simple observation and  yearly exams may be all that is required. Medications and surgery are options for more severe problems. Your caregiver can help you make an informed decision for what is best. Two classes of medications are available for relief of prostate symptoms:  Medications that shrink the prostate. This helps relieve symptoms.  Uncommon side effects include problems with sexual function.  Medications to relax the muscle of the prostate. This also relieves the obstruction.  Side effects can include dizziness, fatigue, lightheadedness, and retrograde ejaculation (diminished volume of ejaculate). Several types of surgical treatments are available for relief of prostate symptoms:  Transurethral resection of the prostate (TURP). In this treatment, an instrument is inserted through opening at the tip of the penis. It is used to cut away pieces of the inner core of the prostate. The pieces are removed through the same opening of the penis. This removes the obstruction and helps get rid of the symptoms.  Transurethral incision (TUIP). In this procedure, small cuts are made in the prostate. This lessens the prostates pressure on the urethra.  Transurethral microwave thermotherapy (TUMT). This procedure uses microwaves to create heat. The heat destroys and removes a  small amount of prostate tissue.  Transurethral needle ablation (TUNA). This is a procedure that uses radio frequencies to do the same as TUMT.  Interstitial laser coagulation (ILC). This is a procedure that uses a laser to do the same as TUMT and TUNA.  Transurethral electrovaporization (TUVP). This is a procedure that uses electrodes to do the same as the procedures listed above. Regardless of the method of treatment chosen, you and your caregiver will discuss the options. With this knowledge, you along with your caregiver can decide upon the best treatment for you. SEEK MEDICAL CARE IF:   You develop chills, fever of 100.5 F (38.1 C), or night  sweats.  There is unexplained back pain.  Symptoms are not helped by medications prescribed.  You develop medication side effects.  Your urine becomes very dark or has a bad smell. SEEK IMMEDIATE MEDICAL CARE IF:   You are suddenly unable to urinate. This is an emergency. You should be seen immediately.  There are large amounts of blood or clots in the urine.  Your urinary problems become unmanageable.  You develop lightheadedness, severe dizziness, or you feel faint.  You develop moderate to severe low back or flank pain.  You develop chills or fever. Document Released: 12/23/2004 Document Revised: 03/17/2011 Document Reviewed: 09/14/2006 [facility] Patient Information 2013 [place], [place].    Benign Prostatic Hyperplasia You have an enlarged prostate. This is common in elderly males. It is called BPH. This stands for benign prostate hyperplasia. The prostate gland is located in base of the bladder. When it grows, the prostate blocks the urethra. This is the tube which drains urine from the bladder.  SYMPTOMS  Weak urine stream.  Dribbling.  Feeling like the bladder has not emptied completely.  Difficulty starting urination.  Getting up frequently at night to urinate.  Urinating more frequently during the day. Complete urinary blockage or severe pain with urination requires immediate attention. DIAGNOSIS   Your caregiver often has a good idea what is wrong by taking a history and doing a physical exam.  Special x-rays may be done. TREATMENT   For mild problems, no treatment may be necessary.  If the problems are moderate, medications may provide relief. Some of these work by making the prostate gland smaller. The herb saw palmetto is commonly used.  If complete blockage occurs, a Foley catheter is usually left in place for a few days.  Surgery is often needed for more severe problems. TURP is the prostate surgery for BPH which is done through the urethra. TURP  stands for transurethral resection of the prostate. It involves cutting away chips from the prostate. It is done by removing chips so that they can come out through the penis.  Techniques using heat, microwave and laser to remove the prostate blockage are also being used. HOME CARE INSTRUCTIONS   Give yourself time when you urinate.  Stay away from alcohol.  Beverages containing caffeine such as coffee, tea and colas can make the problems worse.  Decongestants, antihistamines, and some prescription medicines can also make the problem worse.  Follow up with your caregiver for further treatment as recommended. SEEK IMMEDIATE MEDICAL CARE IF:   You develop increased pain with urination or are unable to pass your water.  You develop severe abdominal pain, vomiting, a high fever, or fainting.  You develop back pain or blood in your urine. MAKE SURE YOU:   Understand these instructions.  Will watch your condition.  Will get help right away if  you are not doing well or get worse. Document Released: 12/23/2004 Document Revised: 03/17/2011 Document Reviewed: 08/28/2006 [facility] Patient Information 2013 [place], [place].

## 2012-02-28 NOTE — Progress Notes (Signed)
Quick Note:  Please notify pt that results are normal.   Provide pt with copy of labs. ______ 

## 2012-02-29 ENCOUNTER — Encounter: Payer: Self-pay | Admitting: *Deleted

## 2012-05-03 ENCOUNTER — Encounter: Payer: 59 | Admitting: Internal Medicine

## 2012-06-10 ENCOUNTER — Other Ambulatory Visit: Payer: Self-pay | Admitting: Gastroenterology

## 2012-07-01 ENCOUNTER — Encounter: Payer: Self-pay | Admitting: Internal Medicine

## 2012-07-21 ENCOUNTER — Ambulatory Visit (INDEPENDENT_AMBULATORY_CARE_PROVIDER_SITE_OTHER): Payer: Medicare Other | Admitting: Family Medicine

## 2012-07-21 VITALS — BP 136/84 | HR 86 | Temp 98.7°F | Resp 18 | Ht 71.0 in | Wt 172.2 lb

## 2012-07-21 DIAGNOSIS — F341 Dysthymic disorder: Secondary | ICD-10-CM

## 2012-07-21 DIAGNOSIS — F32A Depression, unspecified: Secondary | ICD-10-CM

## 2012-07-21 DIAGNOSIS — F419 Anxiety disorder, unspecified: Secondary | ICD-10-CM

## 2012-07-21 MED ORDER — FLUOXETINE HCL 20 MG PO TABS: 20.0000 mg | ORAL_TABLET | Freq: Every day | ORAL | Status: DC

## 2012-07-21 NOTE — Progress Notes (Signed)
[facility]:  Office Visit  Chief Complaint:  Chief Complaint  Patient presents with  . Anxiety    x 1-2 years Would like to discuss treatment   . Depression    HPI: Carl Cook is a 67 y.o. male who complains of anxiety with worsening sxs. He is anxious about everything he is involved with during the day. He worries about chores that he does ie  simple as fixing dinner. He does not have panic attacks but everyhting worries him, having anxiety worries him. He is concerend and so is his daughter, he has been to see a therapist 5-6 times. The anxiety occurs almost evey other day. Has had sxs for about 9 month, started last fall. He is a retired Biomedical scientist and wanted to treat it with diet and exercise and thatr has not helped. Wife died in Jun 01, 2005, he is now in a new relationship and they have been living together. No SI/HI/hallucintations.  He recently had gross hematuria which is being owrked up. He has had no weightloss or appetite loss.  Sleeps well but starts worry the momemnt he wakes up. He is less hopeful. He still has interests and appetite.Hanging out with the same people, he is not isolating himself. No thyroid issues No prior h.o psych issues No family h.o psych issues He has not had dry skin, hair loss, weight gain, memory problems, palpitations  Past Medical History  Diagnosis Date  . Arthritis    Past Surgical History  Procedure Laterality Date  . Appendectomy    . Total hip arthroplasty    . Corneal transplant    . Throat cyst    . Eye surgery    . Joint replacement     History   Social History  . Marital Status: Married    Spouse Name: N/A    Number of Children: N/A  . Years of Education: N/A   Social History Main Topics  . Smoking status: Former Games developer  . Smokeless tobacco: None  . Alcohol Use: Yes  . Drug Use: No  . Sexually Active: None   Other Topics Concern  . None   Social History Narrative  . None   Family History  Problem  Relation Age of Onset  . Cancer Mother     Breast  . Heart disease Father   . Thyroid disease Sister    No Known Allergies Prior to Admission medications   Medication Sig Start Date End Date Taking? Authorizing Provider  allopurinol (ZYLOPRIM) 300 MG tablet Take 1 tablet (300 mg total) by mouth daily. 02/03/12  Yes Carl March, MD  aspirin 81 MG tablet Take 81 mg by mouth daily.   Yes Historical Provider, MD  Multiple Vitamin (MULTIVITAMIN) tablet Take 1 tablet by mouth daily.   Yes Historical Provider, MD  omega-3 acid ethyl esters (LOVAZA) 1 G capsule Take 4 capsules once a day. 02/03/12  Yes Carl March, MD  OVER THE COUNTER MEDICATION OTC Vitamin D 1000 mg daily   Yes Historical Provider, MD     ROS: The patient denies fevers, chills, night sweats, unintentional weight loss, chest pain, palpitations, wheezing, dyspnea on exertion, nausea, vomiting, abdominal pain, dysuria, , melena, numbness, weakness, or tingling. + gross hematuria--being worked up by urologist  All other systems have been reviewed and were otherwise negative with the exception of those mentioned in the HPI and as above.    PHYSICAL EXAM: Filed Vitals:   07/21/12 1559  BP:  136/84  Pulse: 86  Temp: 98.7 F (37.1 C)  Resp: 18   Filed Vitals:   07/21/12 1559  Height: 5\' 11"  (1.803 m)  Weight: 172 lb 3.2 oz (78.109 kg)   Body mass index is 24.03 kg/(m^2).  General: Alert, no acute distress HEENT:  Normocephalic, atraumatic, oropharynx patent. EOMI, PERRLA Cardiovascular:  Regular rate and rhythm, no rubs murmurs or gallops.  No Carotid bruits, radial pulse intact. No pedal edema.  Respiratory: Clear to auscultation bilaterally.  No wheezes, rales, or rhonchi.  No cyanosis, no use of accessory musculature GI: No organomegaly, abdomen is soft and non-tender, positive bowel sounds.  No masses. Skin: No rashes. Neurologic: Facial musculature symmetric. Psychiatric: Patient is appropriate  throughout our interaction. Lymphatic: No cervical lymphadenopathy Musculoskeletal: Gait intact.   LABS: Results for orders placed in visit on 02/26/12  COMPREHENSIVE METABOLIC PANEL      Result Value Range   Sodium 138  135 - 145 mEq/L   Potassium 4.2  3.5 - 5.3 mEq/L   Chloride 103  96 - 112 mEq/L   CO2 24  19 - 32 mEq/L   Glucose, Bld 103 (*) 70 - 99 mg/dL   BUN 20  6 - 23 mg/dL   Creat 4.09  8.11 - 9.14 mg/dL   Total Bilirubin 0.9  0.3 - 1.2 mg/dL   Alkaline Phosphatase 100  39 - 117 U/L   AST 24  0 - 37 U/L   ALT 19  0 - 53 U/L   Total Protein 7.2  6.0 - 8.3 g/dL   Albumin 4.4  3.5 - 5.2 g/dL   Calcium 78.2  8.4 - 95.6 mg/dL  LIPID PANEL      Result Value Range   Cholesterol 186  0 - 200 mg/dL   Triglycerides 213  <086 mg/dL   HDL 52  >57 mg/dL   Total CHOL/HDL Ratio 3.6     VLDL 23  0 - 40 mg/dL   LDL Cholesterol 846 (*) 0 - 99 mg/dL  PSA, MEDICARE      Result Value Range   PSA 0.96  <=4.00 ng/mL  IFOBT (OCCULT BLOOD)      Result Value Range   IFOBT Negative       EKG/XRAY:   Primary read interpreted by Carl Cook at [facility]   ASSESSMENT/PLAN: Encounter Diagnosis  Name Primary?  Marland Kitchen Anxiety and depression Yes   Carl Cook is a pleasant 67 y/o widower who is having increasing sxs of anxiety, some sxs of depression but primarily GAD without trigger. He is worried about everything from completing simp to complex tasks. He was never like this before. He is a retired Advice worker for [employer]. He is currently seeing a therapist which I encourage him to do. We discussed different treatment optiosn and he would liek to try oral meds since therapy, yoga, exercise does not seem to be helping. He spoke with his daughter who is primary doc and she recommended that he try prozac. I have no problems with that and advise that it amy take 4-6 weeks to work, we will need to adjust his dose prn. He is scheduled to see a psychiatrist at the end of July but would like to start on meds  now. We also discussed the option of benzos for this and he prefers not to take it.  Rc Prozac 20 mg daily TSH pending Deneis SI/HI/hallucinations, mania Advise to go to Er prn Gross sideeffects, risk and benefits, and alternatives  of medications d/w patient. Patient is aware that all medications have potential sideeffects and we are unable to predict every sideeffect or drug-drug interaction that may occur.    Garon Melander PHUONG, DO 07/21/2012 4:30 PM

## 2012-07-22 LAB — TSH: TSH: 1.477 u[IU]/mL (ref 0.350–4.500)

## 2012-07-28 ENCOUNTER — Other Ambulatory Visit: Payer: Self-pay | Admitting: Urology

## 2012-07-29 ENCOUNTER — Ambulatory Visit (HOSPITAL_COMMUNITY): Payer: Medicare Other

## 2012-07-29 ENCOUNTER — Encounter (HOSPITAL_COMMUNITY): Payer: Self-pay | Admitting: *Deleted

## 2012-07-29 ENCOUNTER — Ambulatory Visit (HOSPITAL_COMMUNITY)
Admission: RE | Admit: 2012-07-29 | Discharge: 2012-07-29 | Disposition: A | Discharge status: Home / Self Care | Payer: Medicare Other | Source: Ambulatory Visit | Attending: Urology | Admitting: Urology

## 2012-07-29 ENCOUNTER — Encounter (HOSPITAL_COMMUNITY)
Admission: RE | Disposition: A | Discharge status: Home / Self Care | Payer: Self-pay | Source: Ambulatory Visit | Attending: Urology

## 2012-07-29 DIAGNOSIS — M109 Gout, unspecified: Secondary | ICD-10-CM | POA: Insufficient documentation

## 2012-07-29 DIAGNOSIS — Z79899 Other long term (current) drug therapy: Secondary | ICD-10-CM | POA: Insufficient documentation

## 2012-07-29 DIAGNOSIS — E7800 Pure hypercholesterolemia, unspecified: Secondary | ICD-10-CM | POA: Insufficient documentation

## 2012-07-29 DIAGNOSIS — M129 Arthropathy, unspecified: Secondary | ICD-10-CM | POA: Insufficient documentation

## 2012-07-29 DIAGNOSIS — N201 Calculus of ureter: Secondary | ICD-10-CM | POA: Insufficient documentation

## 2012-07-29 DIAGNOSIS — Z7982 Long term (current) use of aspirin: Secondary | ICD-10-CM | POA: Insufficient documentation

## 2012-07-29 DIAGNOSIS — I10 Essential (primary) hypertension: Secondary | ICD-10-CM | POA: Insufficient documentation

## 2012-07-29 DIAGNOSIS — N1330 Unspecified hydronephrosis: Secondary | ICD-10-CM | POA: Insufficient documentation

## 2012-07-29 HISTORY — DX: Anxiety disorder, unspecified: F41.9

## 2012-07-29 HISTORY — DX: Major depressive disorder, single episode, unspecified: F32.9

## 2012-07-29 HISTORY — DX: Depression, unspecified: F32.A

## 2012-07-29 SURGERY — LITHOTRIPSY, ESWL
Anesthesia: LOCAL | Laterality: Left

## 2012-07-29 MED ORDER — ALLOPURINOL 300 MG PO TABS
300.0000 mg | ORAL_TABLET | Freq: Every day | ORAL | Status: DC
  Filled 2012-07-29: qty 1

## 2012-07-29 MED ORDER — DIAZEPAM 5 MG PO TABS
10.0000 mg | ORAL_TABLET | ORAL | Status: AC
  Administered 2012-07-29: 10 mg via ORAL
  Filled 2012-07-29: qty 2

## 2012-07-29 MED ORDER — DEXTROSE-NACL 5-0.45 % IV SOLN
INTRAVENOUS | Status: DC
  Administered 2012-07-29: 16:00:00 via INTRAVENOUS

## 2012-07-29 MED ORDER — DIPHENHYDRAMINE HCL 25 MG PO CAPS
25.0000 mg | ORAL_CAPSULE | ORAL | Status: AC
  Administered 2012-07-29: 25 mg via ORAL
  Filled 2012-07-29: qty 1

## 2012-07-29 MED ORDER — OMEGA-3-ACID ETHYL ESTERS 1 G PO CAPS
1.0000 g | ORAL_CAPSULE | Freq: Every day | ORAL | Status: DC
  Filled 2012-07-29 (×2): qty 1

## 2012-07-29 MED ORDER — HYDROCODONE-ACETAMINOPHEN 5-325 MG PO TABS: 1.0000 | ORAL_TABLET | ORAL | Status: DC | PRN

## 2012-07-29 MED ORDER — TAMSULOSIN HCL 0.4 MG PO CAPS
0.4000 mg | ORAL_CAPSULE | Freq: Every day | ORAL | Status: DC
  Filled 2012-07-29 (×2): qty 1

## 2012-07-29 MED ORDER — CIPROFLOXACIN HCL 500 MG PO TABS
500.0000 mg | ORAL_TABLET | ORAL | Status: AC
  Administered 2012-07-29: 500 mg via ORAL
  Filled 2012-07-29: qty 1

## 2012-07-29 MED ORDER — ASPIRIN 81 MG PO CHEW
81.0000 mg | CHEWABLE_TABLET | Freq: Every day | ORAL | Status: DC
  Filled 2012-07-29 (×2): qty 1

## 2012-07-29 MED ORDER — FLUOXETINE HCL 20 MG PO TABS
20.0000 mg | ORAL_TABLET | Freq: Every day | ORAL | Status: DC
  Filled 2012-07-29 (×2): qty 1

## 2012-07-29 NOTE — H&P (Signed)
History of Present Illness  [name] was seen in the ER on 7/19 for sudden onset of severe left flank pain associated with nausea and vomiting.  He has a past history of kidney stone.  CT scan showed about 4-5 stones in the right kidney, two in the left.  The largest one in each side measures 4 mm.  There is also a 5 mm proximal left ureteral calculus with mild to moderate hydronephrosis.  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  He was discharged home on Flomax.  He has been having mild left flank pain on and off.  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  He follows-up at [facility] on a regular schedule.   Past Medical History Problems  1. History of  Cancer 199.1 2. History of  Hypercholesterolemia 272.0 3. History of  Hypertension 401.9 4. History of  Nephrolithiasis V13.01  Surgical History Problems  1. History of  Cystoscopy (Diagnostic) 2. 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 History of  Cystoscopy With Ureteroscopy 4. History of  Neck Surgery 5. History of  Repair Of Forearm 6. History of  Rhinoplasty  Current Meds 1. Aspirin 81 MG Oral Tablet; Therapy: (Recorded:31Oct2011) to 2. Avapro 300 MG Oral Tablet; Therapy: (Recorded:31Oct2011) to 3. Hydrochlorothiazide CAPS; Therapy: (Recorded:31Oct2011) to 4. Lipitor 10 MG Oral Tablet; Therapy: (Recorded:31Oct2011) to 5.1  6.1  7. Tamsulosin HCl 0.4 MG Oral Capsule; Therapy: 19Jul2014 to  1. Amended By: Su Grand; 07/26/2012 6:42 PMEST   Allergies Medication  1. No Known Drug Allergies  Family History Problems  1. Family history of  Family Health Status - Mother's Age 74 2. Family history of  Family Health Status Number Of Children 2 SONS 3. Family history of  Nephrolithiasis  Social History Problems  1. Alcohol Use 2-3 per wk 2. Caffeine Use 3 PER DAY 3. Family history of  Death In The Family Father AGE 1 - ALZHEIMERS 4. Marital History - Currently Married 5.  Occupation: VP OF OPERATIONS 6. Tobacco Use V15.82 LESS THAN 1 PACK PER DAYSMOKED FOR 15 YEARSQUIT 15 YEARS AGO  Review of Systems Genitourinary, constitutional, skin, eye, otolaryngeal, hematologic/lymphatic, cardiovascular, pulmonary, endocrine, musculoskeletal, gastrointestinal, neurological and psychiatric system(s) were reviewed and pertinent findings if present are noted.  Genitourinary: nocturia and weak urinary stream.    Vitals Vital Signs [Data Includes: Last 1 Day]  21Jul2014 11:38AM  Blood Pressure: 171 / 84 Heart Rate: 70 Respiration: 18  Physical Exam Constitutional: Well nourished and well developed . No acute distress.  ENT:. The ears and nose are normal in appearance.  Neck: The appearance of the neck is normal and no neck mass is present.  Pulmonary: No respiratory distress and normal respiratory rhythm and effort.  Cardiovascular: Heart rate and rhythm are normal . No peripheral edema.  Abdomen: The abdomen is soft and nontender. No masses are palpated. No CVA tenderness. No hernias are palpable. No hepatosplenomegaly noted.  Rectal: Rectal exam demonstrates normal sphincter tone, no tenderness and no masses. Estimated prostate size is 2+. The prostate has no nodularity, is not indurated and is not tender. The left seminal vesicle is nonpalpable. The right seminal vesicle is nonpalpable. The perineum is normal on inspection.  Genitourinary: Examination of the penis demonstrates no discharge, no masses, no lesions and a normal meatus. The scrotum is without lesions. The right epididymis is palpably normal and non-tender. The left epididymis is palpably normal and non-tender. The right testis is non-tender and  without masses. The left testis is non-tender and without masses.  Lymphatics: The femoral and inguinal nodes are not enlarged or tender.  Skin: Normal skin turgor, no visible rash and no visible skin lesions.  Neuro/Psych:. Mood and affect are appropriate.     Results/Data Urine [Data Includes: Last 1 Day]   21Jul2014  COLOR YELLOW   APPEARANCE CLEAR   SPECIFIC GRAVITY 1.010   pH 6.0   GLUCOSE NEG mg/dL  BILIRUBIN NEG   KETONE NEG mg/dL  BLOOD LARGE   PROTEIN NEG mg/dL  UROBILINOGEN 0.2 mg/dL  NITRITE NEG   LEUKOCYTE ESTERASE NEG   SQUAMOUS EPITHELIAL/HPF NONE SEEN   WBC NONE SEEN WBC/hpf  RBC 11-20 RBC/hpf  BACTERIA NONE SEEN   CRYSTALS NONE SEEN   CASTS NONE SEEN     I independently reviewed the CT scan and the findings are as noted above.   Plan Health Maintenance (V70.0)  1. UA With REFLEX  Done: 21Jul2014 11:15AM 1  Proximal Ureteral Stone On The Left (592.1)  2. Oxycodone-Acetaminophen 5-325 MG Oral Tablet; TAKE 1 TO 2 TABLETS EVERY 4 HOURS AS  NEEDED FOR PAIN; Therapy: 21Jul2014 to (Last Rx:21Jul2014) 1  3. Tamsulosin HCl 0.4 MG Oral Capsule; TAKE 1 CAPSULE Bedtime; Therapy: 21Jul2014 to (Last  Rx:21Jul2014)  Requested for: 21Jul2014; Edited 1  4. Follow-up Schedule Surgery Office  Follow-up  Done: 21Jul2014 1   1. Amended By: Su Grand; 07/26/2012 6:42 PMEST    Continue Flomax.  Tretament options were reviewed with the patient: ESL versus ureteroscopy.  I told him that ESL is the least invasive of those procedures.  He had ESL in the past and wishes to proceed.  The risks of the procedure include but are not limited to hemorrhage, infection, injury to adjacent organs, steinstrasse, inability to fragment stone.  He understands and is agreeable. He will also need metabolic stone evaluation.

## 2012-07-29 NOTE — Op Note (Signed)
Refer to [facility] Op Note scanned in the chart 

## 2012-08-11 ENCOUNTER — Other Ambulatory Visit: Payer: Self-pay

## 2012-08-19 ENCOUNTER — Ambulatory Visit (INDEPENDENT_AMBULATORY_CARE_PROVIDER_SITE_OTHER): Payer: Medicare Other | Admitting: Family Medicine

## 2012-08-19 ENCOUNTER — Encounter: Payer: Self-pay | Admitting: Family Medicine

## 2012-08-19 VITALS — BP 124/82 | HR 72 | Temp 99.0°F | Resp 16 | Ht 71.0 in | Wt 168.0 lb

## 2012-08-19 DIAGNOSIS — F39 Unspecified mood [affective] disorder: Secondary | ICD-10-CM

## 2012-08-19 DIAGNOSIS — F411 Generalized anxiety disorder: Secondary | ICD-10-CM | POA: Insufficient documentation

## 2012-08-19 MED ORDER — BUSPIRONE HCL 15 MG PO TABS: ORAL_TABLET | ORAL | Status: DC

## 2012-08-19 NOTE — Patient Instructions (Addendum)
Mood Disorders  Mood disorders are conditions that affect the way a person feels emotionally. The main mood disorders include:  · Depression.  · Bipolar disorder.  · Dysthymia. Dysthymia is a mild, lasting (chronic) depression. Symptoms of dysthymia are similar to depression, but not as severe.  · Cyclothymia. Cyclothymia includes mood swings, but the highs and lows are not as severe as they are in bipolar disorder. Symptoms of cyclothymia are similar to those of bipolar disorder, but less extreme.  CAUSES   Mood disorders are probably caused by a combination of factors. People with mood disorders seem to have physical and chemical changes in their brains. Mood disorders run in families, so there may be genetic causes. Severe trauma or stressful life events may also increase the risk of mood disorders.   SYMPTOMS   Symptoms of mood disorders depend on the specific type of condition.  Depression symptoms include:  · Feeling sad, worthless, or hopeless.  · Negative thoughts.  · Inability to enjoy one's usual activities.  · Low energy.  · Sleeping too much or too little.  · Appetite changes.  · Crying.  · Concentration problems.  · Thoughts of harming oneself.  Bipolar disorder symptoms include:  · Periods of depression (see above symptoms).  · Mood swings, from sadness and depression, to abnormal elation and excitement.  · Periods of mania:  · Racing thoughts.  · Fast speech.  · Poor judgment, and careless, dangerous choices.  · Decreased need for sleep.  · Risky behavior.  · Difficulty concentrating.  · Irritability.  · Increased energy.  · Increased sex drive.  DIAGNOSIS   There are no blood tests or X-rays that can confirm a mood disorder. However, your caregiver may choose to run some tests to make sure that there is not another physical cause for your symptoms. A mood disorder is usually diagnosed after an in-depth interview with a caregiver.  TREATMENT   Mood disorders can be treated with one or more of the  following:  · Medicine. This may include antidepressants, mood-stabilizers, or anti-psychotics.  · Psychotherapy (talk therapy).  · Cognitive behavioral therapy. You are taught to recognize negative thoughts and behavior patterns, and replace them with healthy thoughts and behaviors.  · Electroconvulsive therapy. For very severe cases of deep depression, a series of treatments in which an electrical current is applied to the brain.  · Vagus nerve stimulation. A pulse of electricity is applied to a portion of the brain.  · Transcranial magnetic stimulation. Powerful magnets are placed on the head that produce electrical currents.  · Hospitalization. In severe situations, or when someone is having serious thoughts of harming him or herself, hospitalization may be necessary in order to keep the person safe. This is also done to quickly start and monitor treatment.  HOME CARE INSTRUCTIONS   · Take your medicine exactly as directed.  · Attend all of your therapy sessions.  · Try to eat regular, healthy meals.  · Exercise daily. Exercise may improve mood symptoms.  · Get good sleep.  · Do not drink alcohol or use pot or other drugs. These can worsen mood symptoms and cause anxiety and psychosis.  · Tell your caregiver if you develop any side effects, such as feeling sick to your stomach (nauseous), dry mouth, dizziness, constipation, drowsiness, tremor, weight gain, or sexual symptoms. He or she may suggest things you can do to improve symptoms.  · Learn ways to cope with the stress of having a   thoughts of hurting yourself or others.  You cannot care for yourself.  You develop the sensation of hearing or seeing  something that is not actually present (auditory or visual hallucinations).  You develop abnormal thoughts. Document Released: 10/20/2008 Document Revised: 03/17/2011 Document Reviewed: 10/20/2008 [facility] Patient Information 2014 [place], [place].   I have prescribed Buspar (generic) 15 mg tablets. For the 1st week, take 1/2 tablet twice a day.  In the second week, take 1 tablet in morning and 1/2 tablet in the evening. Continue this dose until I see you in 2 months. Always be consistent with taking your medication in relation to food. If this medication does not seem to help, we can try other medications.

## 2012-08-19 NOTE — Progress Notes (Signed)
S;  This 67 y.o. Cauc male is here to follow-up re: anxiety and medication prescribed 1 month ago by Dr. Conley Rolls at [facility]. Pt reports anxiety not much different than described to Dr. Conley Rolls; he is anxious about minor things but and denies depression. He does report lack of motivation; he is still working and taking on projects but has problems with procrastination then worries about lack of follow-through. He feels like he is having mood swings and has tracked them; he shows a graphic representation of ups and down, almost alternating every other day. Some days he feels like he can accomplish a lot and wakes up feeling motivated; he has trouble sleeping that night if he does not accomplish much. On his "down days", he has no trouble sleeping. He does not have symptoms of mania (no risky behavior or extremes of mood). He continues to go to counseling but has minimal benefit from it. His daughter, a family physician, encourages medication. Pt is willing to try a different medication but not another SSRI; he c/o sexual dysfunction w/ Fluoxetine.  Patient Active Problem List   Diagnosis Date Noted  . Anxiety state, unspecified 08/19/2012  . Hypertrophy of prostate without urinary obstruction and other lower urinary tract symptoms (LUTS) 02/28/2012  . Lipid disorder 02/03/2012  . Gout 02/03/2012   PMHx, Soc Hx and Fam Hx reviewed.  ROS: As per HPI; negative for loss of appetite, CP or tightness, palpitations, SOB or DOE, GI problems, HA, dizziness, numbness, weakness, tremor or syncope. Pt denies significant behavior change, agitation, dysphoric mood, hallucinations, SI/HI or self-harm.  O: Filed Vitals:   08/19/12 1019  BP: 124/82  Pulse: 72  Temp: 99 F (37.2 C)  Resp: 16   GEN: In NAD: WN,WD. Weigh down 10 lbs since Jan 2014. HENT: [place]/AT; EOMI w/ clear conj/sclerae. Otherwise unremarkable. COR: RRR. LUNGS: Normal resp rate and effort. SKIN: W&D; no rashes or pallor. NEURO: A&O x 3; CNs intact.  Nonfocal. PSYCH: Flat affect; pleasant and calm demeanor. Attentive. Good eye contact. Speech pattern and thought content normal. Judgement sound.  BECK's Depression Inventory; score=8 (feels sad, discouraged about the future, more irritated than usual, put off making decisions more, extra effort needed to get started at doing something  A/P: Anxiety state, unspecified- D/C Fluoxetine; trial of Buspar (generic) 15 mg 1/2 tab bid for 1st week then increase dose to 1 tab every AM and 1/2 tab in PM.  Episodic mood disorder- Continue to monitor this; pt may need other medication (i.e Depakote or low dose Seroquel) to help manage mood disorder.  Pt will follow-up via MyChart; he is going out of town in mid-Oct so he will return to office in November.

## 2012-08-28 ENCOUNTER — Encounter: Payer: Self-pay | Admitting: Family Medicine

## 2012-08-31 ENCOUNTER — Other Ambulatory Visit: Payer: Self-pay | Admitting: Family Medicine

## 2012-08-31 MED ORDER — ESCITALOPRAM OXALATE 10 MG PO TABS: ORAL_TABLET | ORAL | Status: DC

## 2012-11-11 ENCOUNTER — Other Ambulatory Visit: Payer: Self-pay

## 2012-11-26 ENCOUNTER — Ambulatory Visit (INDEPENDENT_AMBULATORY_CARE_PROVIDER_SITE_OTHER): Payer: Medicare Other | Admitting: Family Medicine

## 2012-11-26 ENCOUNTER — Encounter: Payer: Self-pay | Admitting: Family Medicine

## 2012-11-26 VITALS — BP 125/85 | HR 66 | Temp 97.7°F | Resp 16 | Ht 70.5 in | Wt 171.0 lb

## 2012-11-26 DIAGNOSIS — Z130 Encounter for screening for diseases of the blood and blood-forming organs and certain disorders involving the immune mechanism: Secondary | ICD-10-CM

## 2012-11-26 DIAGNOSIS — F39 Unspecified mood [affective] disorder: Secondary | ICD-10-CM

## 2012-11-26 DIAGNOSIS — Z1159 Encounter for screening for other viral diseases: Secondary | ICD-10-CM

## 2012-11-26 DIAGNOSIS — F489 Nonpsychotic mental disorder, unspecified: Secondary | ICD-10-CM

## 2012-11-26 DIAGNOSIS — F5105 Insomnia due to other mental disorder: Secondary | ICD-10-CM

## 2012-11-26 DIAGNOSIS — Z23 Encounter for immunization: Secondary | ICD-10-CM

## 2012-11-26 LAB — VITAMIN B12: Vitamin B-12: 467 pg/mL (ref 211–911)

## 2012-11-26 LAB — HEPATITIS C ANTIBODY: HCV Ab: NEGATIVE

## 2012-11-26 LAB — T4, FREE: Free T4: 1.27 ng/dL (ref 0.80–1.80)

## 2012-11-26 LAB — RPR

## 2012-11-26 LAB — T3, FREE: T3, Free: 3.5 pg/mL (ref 2.3–4.2)

## 2012-11-26 NOTE — Progress Notes (Signed)
S:  This 67 y.o. Cauc male returns for follow-up re: insomnia and mood disorder.; he seems to cycle up and down every other day. Pt was prescribed Lexapro but he never took the medication. He would rather deal with this problem w/o prescription medication. He sees a psychologist Surveyor, mining) monthly and has been advised to commit to regular exercise. Pt is walking most days of the week and going to a fitness center. While visiting w/ son and daughter-in-law, she encouraged good nutrition; she follows a "paleo" diet and pt tries to limit processed foods. He prepares much of his meals now that he has returned home. His daughter is a family physician and she recommended low dose Trazodone but pt declines. He has Melatonin but is not taking it. Bedtime routine consists of reading on an electronic device.   Patient Active Problem List   Diagnosis Date Noted  . Anxiety state, unspecified 08/19/2012  . Hypertrophy of prostate without urinary obstruction and other lower urinary tract symptoms (LUTS) 02/28/2012  . Lipid disorder 02/03/2012  . Gout 02/03/2012   PMHx, Surg Hx, Soc and Fam Hx reviewed.  Medications reconciled.  ROS: As per HPI; negative for abnormal weight loss or appetite change, fatigue, sinus problems, swallowing problems or voice change, vision changes, CP or tightness, palpitations, SOB or DOE, GI disturbance, myalgias or gait problems, HA, dizziness, weakness, numbness or syncope. No thoughts of self-harm, SI/HI. No reports of confusion, behavior disturbances or concentration difficulties.  O: Filed Vitals:   11/26/12 0844  BP: 125/85  Pulse: 66  Temp: 97.7 F (36.5 C)  Resp: 16   GEN: in NAD; WN,WD. HENT: [place]/AT; EOMI w/ clear conj/sclerae. EACs/nose/oroph unremarkable. COR: RRR. LUNGS: Normal resp rate and effort. SKIN: W&D; intact w/o erythema, rashes or pallor. MS: MAEs; no deformities or muscle atrophy. Gait normal NEURO: A&O x 3; CNs intact. Nonfocal. PSYCH: Pleasant and  calm demeanor. Conversant and attentive. Mild motor slowing. Speech is  not rapid or pressured. Judgement is sound.   A/P: Moderate mood disorder - Plan: Vitamin B12, TSH, T3, Free, T4, Free  Insomnia due to mental disorder- Start Melatonin low dose 1 tablet before bedtime; do not use electronic devices in bed or within 1 hour of bedtime. Try a calming tea in the evening (i.e. Chamomile) for relaxation.  Need for hepatitis C screening test - Plan: Hepatitis C antibody  Screening for other and unspecified endocrine, nutritional, metabolic, and immunity disorders - Plan: RPR, TSH, T3, Free, T4, Free  Need for prophylactic vaccination and inoculation against influenza - Plan: Flu Vaccine QUAD 36+ mos IM

## 2012-11-26 NOTE — Patient Instructions (Signed)
Insomnia Insomnia is frequent trouble falling and/or staying asleep. Insomnia can be a long term problem or a short term problem. Both are common. Insomnia can be a short term problem when the wakefulness is related to a certain stress or worry. Long term insomnia is often related to ongoing stress during waking hours and/or poor sleeping habits. Overtime, sleep deprivation itself can make the problem worse. Every little thing feels more severe because you are overtired and your ability to cope is decreased. CAUSES   Stress, anxiety, and depression.  Poor sleeping habits.  Distractions such as TV in the bedroom.  Naps close to bedtime.  Engaging in emotionally charged conversations before bed.  Technical reading before sleep.  Alcohol and other sedatives. They may make the problem worse. They can hurt normal sleep patterns and normal dream activity.  Stimulants such as caffeine for several hours prior to bedtime.  Pain syndromes and shortness of breath can cause insomnia.  Exercise late at night.  Changing time zones may cause sleeping problems (jet lag). It is sometimes helpful to have someone observe your sleeping patterns. They should look for periods of not breathing during the night (sleep apnea). They should also look to see how long those periods last. If you live alone or observers are uncertain, you can also be observed at a sleep clinic where your sleep patterns will be professionally monitored. Sleep apnea requires a checkup and treatment. Give your caregivers your medical history. Give your caregivers observations your family has made about your sleep.  SYMPTOMS   Not feeling rested in the morning.  Anxiety and restlessness at bedtime.  Difficulty falling and staying asleep. TREATMENT   Your caregiver may prescribe treatment for an underlying medical disorders. Your caregiver can give advice or help if you are using alcohol or other drugs for self-medication. Treatment  of underlying problems will usually eliminate insomnia problems.  Medications can be prescribed for short time use. They are generally not recommended for lengthy use.  Over-the-counter sleep medicines are not recommended for lengthy use. They can be habit forming.  You can promote easier sleeping by making lifestyle changes such as:  Using relaxation techniques that help with breathing and reduce muscle tension.  Exercising earlier in the day.  Changing your diet and the time of your last meal. No night time snacks.  Establish a regular time to go to bed.  Counseling can help with stressful problems and worry.  Soothing music and white noise may be helpful if there are background noises you cannot remove.  Stop tedious detailed work at least one hour before bedtime. HOME CARE INSTRUCTIONS   Keep a diary. Inform your caregiver about your progress. This includes any medication side effects. See your caregiver regularly. Take note of:  Times when you are asleep.  Times when you are awake during the night.  The quality of your sleep.  How you feel the next day. This information will help your caregiver care for you.  Get out of bed if you are still awake after 15 minutes. Read or do some quiet activity. Keep the lights down. Wait until you feel sleepy and go back to bed.  Keep regular sleeping and waking hours. Avoid naps.  Exercise regularly.  Avoid distractions at bedtime. Distractions include watching television or engaging in any intense or detailed activity like attempting to balance the household checkbook.  Develop a bedtime ritual. Keep a familiar routine of bathing, brushing your teeth, climbing into bed at the same   time each night, listening to soothing music. Routines increase the success of falling to sleep faster.  Use relaxation techniques. This can be using breathing and muscle tension release routines. It can also include visualizing peaceful scenes. You can  also help control troubling or intruding thoughts by keeping your mind occupied with boring or repetitive thoughts like the old concept of counting sheep. You can make it more creative like imagining planting one beautiful flower after another in your backyard garden.  During your day, work to eliminate stress. When this is not possible use some of the previous suggestions to help reduce the anxiety that accompanies stressful situations. MAKE SURE YOU:   Understand these instructions.  Will watch your condition.  Will get help right away if you are not doing well or get worse. Document Released: 12/21/1999 Document Revised: 03/17/2011 Document Reviewed: 01/20/2007 [facility] Patient Information 2014 [place], [place].    Melatonin- the recommended starting dose is 0.3 mg  1 tablet 30 minutes before bedtime.  Commit to daily exercise. Do not use an electronic device for bedtime reading. Try a calming tea near bedtime (Chamomile is soothing and promotes sleep and relaxation).  I will look into other herbal supplements that may be helpful for mood disorders; we will discuss them at follow-up or I may contact you before your next appointment.

## 2012-11-28 ENCOUNTER — Encounter: Payer: Self-pay | Admitting: Family Medicine

## 2013-01-05 ENCOUNTER — Other Ambulatory Visit: Payer: Self-pay | Admitting: Family Medicine

## 2013-01-06 NOTE — Telephone Encounter (Signed)
Dr Audria NineMcPherson, you just saw pt in Nov but not for gout. Can we RF?

## 2013-01-07 NOTE — Telephone Encounter (Signed)
Allopurinol 300 mg refilled x 6 months.

## 2013-01-19 ENCOUNTER — Other Ambulatory Visit: Payer: Self-pay | Admitting: Family Medicine

## 2013-01-20 ENCOUNTER — Ambulatory Visit: Payer: Medicare Other | Admitting: Family Medicine

## 2013-07-01 ENCOUNTER — Telehealth: Payer: Self-pay | Admitting: Radiology

## 2013-07-01 NOTE — Telephone Encounter (Signed)
Noted/ and will address at the office visit

## 2013-07-01 NOTE — Telephone Encounter (Signed)
Message copied by Caffie DammeLITTRELL, Lacoya Wilbanks W on Fri Jul 01, 2013  2:30 PM ------      Message from: Fayne MediateMALPASS, DANA S      Created: Wed Jun 29, 2013 10:55 AM      Regarding: [facility] Quality Patient Assessment Form (HQPAF)       Good morning,             My name is Mathews Argyleana Malpass, RN, and I am presently working on the [employer] [facility] project, closing healthcare gaps.            The aforementioned patient is presently scheduled for an office visit on 07/05/13 at 3pm.  The following information is needed to satisfy the closure of his gaps.            Early Detection            Has the patient been screened for the following conditions in the last 12 months?            Please consider screening for these chronic illness(es) based on previously reported risk factors and/or co-morbid conditions and document any confirmed conditions that currently exist in your progress notes.            CHRONIC ILLNESS TO CONSIDER FOR EVALUATION:       RISK FACTORS AND/OR CO-MORBID CONDITIONS:       SCREENING COMPLETED?             Cognitive Function       Screening using tool such as 6CIT(c)             Yes No            Depression       Screening using tool such as PHQ-9(c)             Yes No            Physical Activity       Access & encourage physical activity/exercise             Yes No            Urinary Incontinence       Prostatic Enlargement             Yes No                  Ongoing Assessment and Evaluation            Please confirm which conditions currently exist with supporting documentation in your progress note(s).            Potential Diagnosis:             Risk Factors and/or co-morbid conditions:       Last Reported:       Can the condition be diagnosed currently             088 Hypertension       Previously Coded       2014               Yes   No                        023 Disorders of Lipoid Metabolism       Previously Coded       2014         Yes   No  062 Depression       Previously Coded       2014               Yes   No                        058 Major Depressive, Bipolar, And Paranoid Disorders (296.xx, 296.7, 297.x, E950.x - E959)       Previously Coded       2014               Yes   No             I thought it might be more beneficial for you to see what [facility] is looking for.  Thank you & contact me with any questions or concerns.            Mathews Argyleana Malpass, RN       ------

## 2013-07-05 ENCOUNTER — Encounter: Payer: Self-pay | Admitting: Family Medicine

## 2013-07-05 ENCOUNTER — Ambulatory Visit (INDEPENDENT_AMBULATORY_CARE_PROVIDER_SITE_OTHER): Payer: Medicare Other | Admitting: Family Medicine

## 2013-07-05 VITALS — BP 120/70 | HR 62 | Temp 98.7°F | Resp 16 | Ht 70.5 in | Wt 174.4 lb

## 2013-07-05 DIAGNOSIS — R739 Hyperglycemia, unspecified: Secondary | ICD-10-CM

## 2013-07-05 DIAGNOSIS — F39 Unspecified mood [affective] disorder: Secondary | ICD-10-CM

## 2013-07-05 DIAGNOSIS — R7309 Other abnormal glucose: Secondary | ICD-10-CM

## 2013-07-05 DIAGNOSIS — Z0000 Encounter for general adult medical examination without abnormal findings: Secondary | ICD-10-CM

## 2013-07-05 DIAGNOSIS — N400 Enlarged prostate without lower urinary tract symptoms: Secondary | ICD-10-CM

## 2013-07-05 DIAGNOSIS — E789 Disorder of lipoprotein metabolism, unspecified: Secondary | ICD-10-CM

## 2013-07-05 DIAGNOSIS — Z87448 Personal history of other diseases of urinary system: Secondary | ICD-10-CM

## 2013-07-05 LAB — LIPID PANEL
Cholesterol: 194 mg/dL (ref 0–200)
HDL: 48 mg/dL (ref >39)
LDL CALC: 129 mg/dL — AB (ref 0–99)
TRIGLYCERIDES: 83 mg/dL (ref <150)
Total CHOL/HDL Ratio: 4 Ratio
VLDL: 17 mg/dL (ref 0–40)

## 2013-07-05 LAB — COMPLETE METABOLIC PANEL WITH GFR
ALT: 17 U/L (ref 0–53)
AST: 20 U/L (ref 0–37)
Albumin: 4.3 g/dL (ref 3.5–5.2)
Alkaline Phosphatase: 93 U/L (ref 39–117)
BUN: 15 mg/dL (ref 6–23)
CALCIUM: 9.6 mg/dL (ref 8.4–10.5)
CHLORIDE: 102 meq/L (ref 96–112)
CO2: 26 meq/L (ref 19–32)
CREATININE: 0.78 mg/dL (ref 0.50–1.35)
GLUCOSE: 110 mg/dL — AB (ref 70–99)
Potassium: 4.1 mEq/L (ref 3.5–5.3)
SODIUM: 137 meq/L (ref 135–145)
TOTAL PROTEIN: 6.8 g/dL (ref 6.0–8.3)
Total Bilirubin: 0.6 mg/dL (ref 0.2–1.2)

## 2013-07-05 LAB — POCT URINALYSIS DIPSTICK
BILIRUBIN UA: NEGATIVE
Blood, UA: NEGATIVE
Glucose, UA: NEGATIVE
KETONES UA: NEGATIVE
LEUKOCYTES UA: NEGATIVE
NITRITE UA: NEGATIVE
PH UA: 7.5
Protein, UA: NEGATIVE
Spec Grav, UA: 1.015
Urobilinogen, UA: 1

## 2013-07-05 LAB — POCT GLYCOSYLATED HEMOGLOBIN (HGB A1C): HEMOGLOBIN A1C: 5.4

## 2013-07-05 MED ORDER — ALLOPURINOL 300 MG PO TABS: ORAL_TABLET | ORAL | Status: DC

## 2013-07-05 NOTE — Progress Notes (Signed)
Subjective:    Patient ID: Carl Cook, male    DOB: 10-01-1945, 68 y.o.   MRN: [id number]  HPI  This 68 y.o. Cauc male is here for annual MCR CPE. He has a hx of mood disorder/ depression; medications have not proven effective for him. He has scheduled to resume counseling and will contact clinic if he decides to resume medication. Regular exercise is helpful. He drinks only socially.   HCM: CRS- Current (June 2014 normal).           IMM- Current.  Patient Active Problem List   Diagnosis Date Noted  . 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 Moderate mood disorder 11/26/2012  . Anxiety state, unspecified 08/19/2012  . Hypertrophy of prostate without urinary obstruction and other lower urinary tract symptoms (LUTS) 02/28/2012  . Lipid disorder 02/03/2012  . Gout 02/03/2012    Prior to Admission medications   Medication Sig Start Date End Date Taking? Authorizing Provider  allopurinol (ZYLOPRIM) 300 MG tablet TAKE 1 TABLET (300 MG TOTAL) BY MOUTH DAILY.   Yes Maurice March, MD  aspirin 81 MG tablet Take 81 mg by mouth daily.   Yes Historical Provider, MD  omega-3 acid ethyl esters (LOVAZA) 1 G capsule TAKE 4 CAPSULES ONCE A DAY. 01/19/13  Yes Maurice March, MD  OVER THE COUNTER MEDICATION OTC Vitamin D 1000 mg daily   Yes Historical Provider, MD   PMHx, Surg Hx, Soc and Fam Hx reviewed.                        Review of Systems  Constitutional: Negative.   HENT: Negative.   Eyes: Negative.   Respiratory: Negative.   Cardiovascular: Negative.   Gastrointestinal: Negative.   Endocrine: Negative.   Genitourinary: Positive for hematuria.       Urology treated pt for L ureteral stone (ESWL).  Musculoskeletal: Negative.   Skin: Negative.   Allergic/Immunologic: Negative.   Neurological: Negative.   Hematological: Negative.   Psychiatric/Behavioral: Positive for dysphoric mood.      Objective:   Physical Exam  Nursing note and vitals  reviewed. Constitutional: He is oriented to person, place, and time. Vital signs are normal. He appears well-developed and well-nourished. No distress.  HENT:  Head: Normocephalic and atraumatic.  Right Ear: Hearing, tympanic membrane, external ear and ear canal normal.  Left Ear: Hearing, tympanic membrane, external ear and ear canal normal.  Nose: Nose normal. No nasal deformity or septal deviation.  Mouth/Throat: Uvula is midline, oropharynx is clear and moist and mucous membranes are normal. No oral lesions. Normal dentition. No posterior oropharyngeal erythema.  Eyes: Conjunctivae, EOM and lids are normal. Pupils are equal, round, and reactive to light. No scleral icterus.  Fundoscopic exam:      The right eye shows no hemorrhage and no papilledema. The right eye shows red reflex.       The left eye shows no hemorrhage and no papilledema. The left eye shows red reflex.  Neck: Normal range of motion and full passive range of motion without pain. Neck supple. No JVD present. No spinous process tenderness and no muscular tenderness present. Carotid bruit is not present. No mass and no thyromegaly present.  Cardiovascular: Normal rate, regular rhythm, S1 normal, S2 normal, normal heart sounds and normal pulses.   No extrasystoles are present. PMI is not displaced.  Exam reveals no gallop and no friction rub.   No murmur heard. Pulmonary/Chest:  Effort normal and breath sounds normal. No respiratory distress.  Abdominal: Soft. Normal appearance and bowel sounds are normal. He exhibits no distension, no abdominal bruit, no pulsatile midline mass and no mass. There is no hepatosplenomegaly. There is no tenderness. There is no guarding and no CVA tenderness. No hernia.  Genitourinary:  Deferred. Pt has f/u w/ Urology; he will inform that specialist that DRE was not performed at this exam.  Musculoskeletal:       Cervical back: Normal.       Thoracic back: Normal.       Lumbar back: Normal.   Remainder of exam unremarkable (mild degenerative joint changes noted).  Lymphadenopathy:       Head (right side): No submental, no submandibular, no tonsillar, no posterior auricular and no occipital adenopathy present.       Head (left side): No submental, no submandibular, no tonsillar, no posterior auricular and no occipital adenopathy present.    He has no cervical adenopathy.       Right: No inguinal and no supraclavicular adenopathy present.       Left: No inguinal and no supraclavicular adenopathy present.  Neurological: He is alert and oriented to person, place, and time. He has normal strength and normal reflexes. He displays no atrophy and no tremor. No cranial nerve deficit or sensory deficit. He exhibits normal muscle tone. He displays a negative Romberg sign. Coordination and gait normal.  Skin: Skin is warm, dry and intact. No ecchymosis, no lesion and no rash noted. He is not diaphoretic. No cyanosis or erythema. No pallor.  Psychiatric: His speech is normal and behavior is normal. Judgment and thought content normal. His mood appears not anxious. His affect is not labile and not inappropriate. Cognition and memory are normal. He exhibits a depressed mood.    Results for orders placed in visit on 07/05/13  POCT GLYCOSYLATED HEMOGLOBIN (HGB A1C)      Result Value Ref Range   Hemoglobin A1C 5.4    POCT URINALYSIS DIPSTICK      Result Value Ref Range   Color, UA yellow     Clarity, UA clear     Glucose, UA neg     Bilirubin, UA neg     Ketones, UA neg     Spec Grav, UA 1.015     Blood, UA neg     pH, UA 7.5     Protein, UA neg     Urobilinogen, UA 1.0     Nitrite, UA neg     Leukocytes, UA Negative        Assessment & Plan:  Routine general medical examination at a health care facility  Lipid disorder - Plan: Lipid panel, COMPLETE METABOLIC PANEL WITH GFR  Hypertrophy of prostate without urinary obstruction and other lower urinary tract symptoms (LUTS) - Pt to  advise Dr. Brunilda PayorNesi that DRE was not performed at this visit; I encouraged him to have Dr. Brunilda PayorNesi perform this exam (BPH noted on exam last year). PSA was normal.  Plan: PSA, Medicare  History of hematuria - Plan: POCT urinalysis dipstick  Hyperglycemia - Plan: POCT glycosylated hemoglobin (Hb A1C), COMPLETE METABOLIC PANEL WITH GFR  Moderate Mood Disorder- Agree w/ counseling; pt will return to clinic for medication if he has no improvement in next few months.   Meds ordered this encounter  Medications  . allopurinol (ZYLOPRIM) 300 MG tablet    Sig: TAKE 1 TABLET (300 MG TOTAL) BY MOUTH DAILY.    Dispense:  90 tablet    Refill:  3

## 2013-07-05 NOTE — Patient Instructions (Signed)
Keeping you healthy  Get these tests  Blood pressure- Have your blood pressure checked once a year by your healthcare provider.  Normal blood pressure is 120/80  Weight- Have your body mass index (BMI) calculated to screen for obesity.  BMI is a measure of body fat based on height and weight. You can also calculate your own BMI at [email or web address]/.  Cholesterol- Have your cholesterol checked every year.  Diabetes- Have your blood sugar checked regularly if you have high blood pressure, high cholesterol, have a family history of diabetes or if you are overweight.  Screening for Colon Cancer- Colonoscopy starting at age 67.  Screening may begin sooner depending on your family history and other health conditions. Follow up colonoscopy as directed by your Gastroenterologist.  Screening for Prostate Cancer- Both blood work (PSA) and a rectal exam help screen for Prostate Cancer.  Screening begins at age 67 with African-American men and at age 67 with Caucasian men.  Screening may begin sooner depending on your family history.  Take these medicines  Aspirin- One aspirin daily can help prevent Heart disease and Stroke.  Flu shot- Every fall.  Tetanus- Every 10 years.  Zostavax- Once after the age of 67 to prevent Shingles.  Pneumonia shot- Once after the age of 67; if you are younger than 7065, ask your healthcare provider if you need a Pneumonia shot.  Take these steps  Don't smoke- If you do smoke, talk to your doctor about quitting.  For tips on how to quit, go to www.smokefree.gov or call 1-800-QUIT-NOW.  Be physically active- Exercise 5 days a week for at least 30 minutes.  If you are not already physically active start slow and gradually work up to 30 minutes of moderate physical activity.  Examples of moderate activity include walking briskly, mowing the yard, dancing, swimming, bicycling, etc.  Eat a healthy diet- Eat a variety of healthy food such as fruits, vegetables, low  fat milk, low fat cheese, yogurt, lean meant, poultry, fish, beans, tofu, etc. For more information go to www.thenutritionsource.org  Drink alcohol in moderation- Limit alcohol intake to less than two drinks a day. Never drink and drive.  Dentist- Brush and floss twice daily; visit your dentist twice a year.  Depression- Your emotional health is as important as your physical health. If you're feeling down, or losing interest in things you would normally enjoy please talk to your healthcare provider.  Eye exam- Visit your eye doctor every year.  Safe sex- If you may be exposed to a sexually transmitted infection, use a condom.  Seat belts- Seat belts can save your life; always wear one.  Smoke/Carbon Monoxide detectors- These detectors need to be installed on the appropriate level of your home.  Replace batteries at least once a year.  Skin cancer- When out in the sun, cover up and use sunscreen 15 SPF or higher.  Violence- If anyone is threatening you, please tell your healthcare provider.  Living Will/ Health care power of attorney- Speak with your healthcare provider and family.   You have indicated that you will schedule a visit with Dr. Brunilda Cook; please inform him that I deferred the prostate exam to him.  You have stated that you plan to resume therapy with a counselor; if your think that you need to try another medication, please schedule a follow-up.

## 2013-07-06 LAB — PSA, MEDICARE: PSA: 0.57 ng/mL (ref <=4.00)

## 2013-09-20 ENCOUNTER — Other Ambulatory Visit: Payer: Self-pay | Admitting: Family Medicine

## 2013-10-11 ENCOUNTER — Encounter: Payer: Self-pay | Admitting: Family Medicine

## 2013-12-17 ENCOUNTER — Other Ambulatory Visit: Payer: Self-pay | Admitting: Family Medicine

## 2014-01-18 ENCOUNTER — Ambulatory Visit (INDEPENDENT_AMBULATORY_CARE_PROVIDER_SITE_OTHER): Payer: Medicare Other | Admitting: Family Medicine

## 2014-01-18 ENCOUNTER — Ambulatory Visit (INDEPENDENT_AMBULATORY_CARE_PROVIDER_SITE_OTHER): Payer: Medicare Other

## 2014-01-18 VITALS — BP 122/80 | HR 77 | Temp 98.4°F | Resp 18 | Ht 71.0 in | Wt 179.0 lb

## 2014-01-18 DIAGNOSIS — R05 Cough: Secondary | ICD-10-CM

## 2014-01-18 DIAGNOSIS — J209 Acute bronchitis, unspecified: Secondary | ICD-10-CM

## 2014-01-18 DIAGNOSIS — R059 Cough, unspecified: Secondary | ICD-10-CM

## 2014-01-18 MED ORDER — HYDROCODONE-HOMATROPINE 5-1.5 MG/5ML PO SYRP
5.0000 mL | ORAL_SOLUTION | Freq: Three times a day (TID) | ORAL | Status: DC | PRN
Start: 2014-01-18 — End: 2014-06-01

## 2014-01-18 MED ORDER — AZITHROMYCIN 250 MG PO TABS: ORAL_TABLET | ORAL | Status: DC

## 2014-01-18 NOTE — Progress Notes (Signed)
Patient ID: Carl Cook MRN: [id number], DOB: September 16, 1945, 69 y.o. Date of Encounter: 01/18/2014, 8:15 AM  Primary Physician: Dow Adolph, MD  Chief Complaint:  Chief Complaint  Patient presents with  . Cough    x3-4 day yellow thick mucous     HPI: 69 y.o. year old male presents with a 28 day history of nasal congestion, post nasal drip, sore throat, and cough. Mild sinus pressure. Afebrile. No chills. Nasal congestion thick and green/yellow. Cough is productive of green/yellow sputum and not associated with time of day. Ears feel full, leading to sensation of muffled hearing. Has tried OTC cold preps without success. No GI complaints.   No sick contacts, recent antibiotics, or recent travels.   Patient converted his PPD after service in [place] and took a course of INH.  He works as PA at [employer].  No leg trauma, sedentary periods, h/o cancer, or tobacco use.  Past Medical History  Diagnosis Date  . Arthritis   . Anxiety   . Depression      Home Meds: Prior to Admission medications   Medication Sig Start Date End Date Taking? Authorizing Provider  allopurinol (ZYLOPRIM) 300 MG tablet TAKE 1 TABLET (300 MG TOTAL) BY MOUTH DAILY. 07/05/13  Yes Maurice March, MD  aspirin 81 MG tablet Take 81 mg by mouth daily.   Yes Historical Provider, MD  omega-3 acid ethyl esters (LOVAZA) 1 G capsule TAKE 4 CAPSULES ONCE A DAY. 09/22/13  Yes Morrell Riddle, PA-C  OVER THE COUNTER MEDICATION OTC Vitamin D 1000 mg daily   Yes Historical Provider, MD    Allergies: No Known Allergies  History   Social History  . Marital Status: Widowed    Spouse Name: N/A    Number of Children: N/A  . Years of Education: N/A   Occupational History  . Retired-Physician Asst    Social History Main Topics  . Smoking status: Former Games developer  . Smokeless tobacco: Not on file  . Alcohol Use: Yes     Comment: 14 drinks  . Drug Use: No  . Sexual Activity: Yes   Other Topics Concern  .  Not on file   Social History Narrative   Widow. Significant other. Education: College/other     Review of Systems: Constitutional: negative for chills, fever, night sweats or weight changes Cardiovascular: negative for chest pain or palpitations Respiratory: negative for hemoptysis, wheezing, or shortness of breath Abdominal: negative for abdominal pain, nausea, vomiting or diarrhea Dermatological: negative for rash Neurologic: negative for headache   Physical Exam: Blood pressure 122/80, pulse 77, temperature 98.4 F (36.9 C), temperature source Oral, resp. rate 18, height  (1.803 m), weight 179 lb (81.194 kg), SpO2 98 %., Body mass index is 24.98 kg/(m^2). General: Well developed, well nourished, in no acute distress. Head: Normocephalic, atraumatic, eyes without discharge, sclera non-icteric, nares are congested. Bilateral auditory canals clear, TM's are without perforation, pearly grey with reflective cone of light bilaterally. No sinus TTP. Oral cavity moist, dentition normal. Posterior pharynx with post nasal drip and mild erythema. No peritonsillar abscess or tonsillar exudate. Neck: Supple. No thyromegaly. Full ROM. No lymphadenopathy. Lungs: Coarse breath sounds bilaterally without wheezes, rales, or rhonchi. Breathing is unlabored.  Heart: RRR with S1 S2. No murmurs, rubs, or gallops appreciated. Msk:  Strength and tone normal for age. Extremities: No clubbing or cyanosis. No edema. Neuro: Alert and oriented X 3. Moves all extremities spontaneously. CNII-XII grossly in tact. Psych:  Responds  to questions appropriately with a normal affect.  [facility] reading (PRIMARY) by  Dr. Milus GlazierLauenstein:  No infiltrates on CXR.     ASSESSMENT AND PLAN:  69 y.o. year old male with bronchitis. Cough - Plan: DG Chest 2 View, HYDROcodone-homatropine (HYCODAN) 5-1.5 MG/5ML syrup  Acute bronchitis, unspecified organism - Plan: azithromycin (ZITHROMAX Z-PAK) 250 MG tablet   - No diagnosis  found. -Tylenol/Motrin prn -Rest/fluids -RTC precautions -RTC 3-5 days if no improvement  Signed, Elvina SidleKurt Briane Birden, MD 01/18/2014 8:15 AM

## 2014-01-18 NOTE — Patient Instructions (Signed)

## 2014-03-10 ENCOUNTER — Other Ambulatory Visit: Payer: Self-pay | Admitting: Physician Assistant

## 2014-04-12 ENCOUNTER — Encounter: Payer: Self-pay | Admitting: *Deleted

## 2014-04-17 ENCOUNTER — Other Ambulatory Visit: Payer: Self-pay | Admitting: Family Medicine

## 2014-04-18 ENCOUNTER — Telehealth: Payer: Self-pay | Admitting: *Deleted

## 2014-04-18 NOTE — Telephone Encounter (Signed)
Patient phoned in response to my letter.  When he learned Dr. Gildardo GriffesMac was leaving, he preferred to go ahead and make his appt with someone new. Scheduled CPE with Deliah BostonMichael Clark, PA-C for 05/15/14 @ 1600.  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

## 2014-05-15 ENCOUNTER — Encounter: Payer: Self-pay | Admitting: *Deleted

## 2014-05-15 ENCOUNTER — Telehealth: Payer: Self-pay

## 2014-05-15 ENCOUNTER — Encounter: Payer: Self-pay | Admitting: Physician Assistant

## 2014-05-24 ENCOUNTER — Ambulatory Visit: Payer: Medicare Other | Admitting: Family Medicine

## 2014-06-01 ENCOUNTER — Ambulatory Visit (INDEPENDENT_AMBULATORY_CARE_PROVIDER_SITE_OTHER): Payer: Medicare Other | Admitting: Family Medicine

## 2014-06-01 VITALS — BP 142/74 | HR 92 | Temp 99.0°F | Resp 18 | Ht 71.0 in | Wt 183.0 lb

## 2014-06-01 DIAGNOSIS — F329 Major depressive disorder, single episode, unspecified: Secondary | ICD-10-CM | POA: Diagnosis not present

## 2014-06-01 DIAGNOSIS — M10011 Idiopathic gout, right shoulder: Secondary | ICD-10-CM | POA: Diagnosis not present

## 2014-06-01 DIAGNOSIS — Z87442 Personal history of urinary calculi: Secondary | ICD-10-CM

## 2014-06-01 DIAGNOSIS — S30861A Insect bite (nonvenomous) of abdominal wall, initial encounter: Secondary | ICD-10-CM

## 2014-06-01 DIAGNOSIS — H18602 Keratoconus, unspecified, left eye: Secondary | ICD-10-CM

## 2014-06-01 DIAGNOSIS — Z0000 Encounter for general adult medical examination without abnormal findings: Secondary | ICD-10-CM | POA: Diagnosis not present

## 2014-06-01 DIAGNOSIS — W57XXXA Bitten or stung by nonvenomous insect and other nonvenomous arthropods, initial encounter: Secondary | ICD-10-CM | POA: Diagnosis not present

## 2014-06-01 DIAGNOSIS — F32A Depression, unspecified: Secondary | ICD-10-CM

## 2014-06-01 DIAGNOSIS — E785 Hyperlipidemia, unspecified: Secondary | ICD-10-CM | POA: Diagnosis not present

## 2014-06-01 DIAGNOSIS — N433 Hydrocele, unspecified: Secondary | ICD-10-CM

## 2014-06-01 LAB — POCT URINALYSIS DIPSTICK
Bilirubin, UA: NEGATIVE
Blood, UA: NEGATIVE
GLUCOSE UA: NEGATIVE
KETONES UA: NEGATIVE
Leukocytes, UA: NEGATIVE
Nitrite, UA: NEGATIVE
PROTEIN UA: 30
Spec Grav, UA: 1.025
UROBILINOGEN UA: 1
pH, UA: 6.5

## 2014-06-01 LAB — POCT UA - MICROSCOPIC ONLY
Bacteria, U Microscopic: NEGATIVE
CASTS, UR, LPF, POC: NEGATIVE
Epithelial cells, urine per micros: NEGATIVE
Sperm: POSITIVE
YEAST UA: NEGATIVE

## 2014-06-01 LAB — LIPID PANEL
Cholesterol: 203 mg/dL — ABNORMAL HIGH (ref 0–200)
HDL: 51 mg/dL (ref >=40)
LDL Cholesterol: 128 mg/dL — ABNORMAL HIGH (ref 0–99)
Total CHOL/HDL Ratio: 4 Ratio
Triglycerides: 119 mg/dL (ref <150)
VLDL: 24 mg/dL (ref 0–40)

## 2014-06-01 LAB — COMPREHENSIVE METABOLIC PANEL
ALT: 20 U/L (ref 0–53)
AST: 23 U/L (ref 0–37)
Albumin: 4.7 g/dL (ref 3.5–5.2)
Alkaline Phosphatase: 89 U/L (ref 39–117)
BILIRUBIN TOTAL: 0.8 mg/dL (ref 0.2–1.2)
BUN: 17 mg/dL (ref 6–23)
CALCIUM: 10.4 mg/dL (ref 8.4–10.5)
CO2: 28 meq/L (ref 19–32)
Chloride: 102 mEq/L (ref 96–112)
Creat: 0.79 mg/dL (ref 0.50–1.35)
GLUCOSE: 96 mg/dL (ref 70–99)
Potassium: 4.5 mEq/L (ref 3.5–5.3)
Sodium: 140 mEq/L (ref 135–145)
TOTAL PROTEIN: 7.8 g/dL (ref 6.0–8.3)

## 2014-06-01 LAB — TSH: TSH: 0.991 u[IU]/mL (ref 0.350–4.500)

## 2014-06-01 LAB — URIC ACID: Uric Acid, Serum: 5.3 mg/dL (ref 4.0–7.8)

## 2014-06-01 MED ORDER — ALLOPURINOL 300 MG PO TABS: 300.0000 mg | ORAL_TABLET | Freq: Every day | ORAL | Status: DC

## 2014-06-01 NOTE — Patient Instructions (Signed)
Continue current medications  I will let you know the results of your labs  Continue following with your psychiatrist  Return at anytime if we can be of assistance

## 2014-06-01 NOTE — Progress Notes (Signed)
Physical exam:  History: [age] year old man here for physical examination. He has been in fairly good health except for struggling with depression. He is currently seeing Dr. Jennelle Humanottle, psychiatrist, for management of that. No other major acute complaints. He needs refill on his allopurinol. He has a history of a kidney stone passed. Wondered whether he should get a vitamin D level. He takes vitamin D on a regular basis. He also has a history of hyperlipidemia for which takes Lovaza. He would like to have his lipids checked.  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 His mother died 2 weeks ago of chronic kidney disease. The father died sometime back of coronary artery disease. His siblings are living and well.  Social history: He is a widower for about 7 years. He has had a significant other for 3 or 4 years. They're thinking of selling the place here in moving to [place] area. Patient does not smoke. He does drink about 2 drinks per day. He does not limit use any illicit drugs. He has 2 children and 5 grandchildren. 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 He is a member of the [employer] but not very active at this point.  Review of systems: Constitutional: Unremarkable HEENT: Unremarkable except for some visual problems in the left eye from the keratoconus Respiratory: Unremarkable Cardiovascular: Unremarkable Gastrointestinal: Unremarkable Endocrine: Unremarkable Genitourinary: Unremarkable except for the presence of some  hydrocele cysts which she is followed by the urologist for Muscular skeletal: Unremarkable Dermatologic: Unremarkable Allergy: Unremarkable Neurologic: Unremarkable Hematologic: Unremarkable Psychiatric: Chronic sad mood over the last few years  Physical exam: Well-developed well-nourished man in no acute distress. Somewhat flat affect. His TMs are normal. Eyes PERRLA. 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 Right fundus appears benign. Throat clear. Teeth good. Neck supple without nodes or thyromegaly. No carotid bruits. Chest is clear to auscultation. Heart regular without murmurs gallops or arrhythmias. No axillary or inguinal nodes. Abdomen has normal bowel sounds, soft without masses or tenderness. Normal male external genitalia except for the right side of the scrotum is enlarged with palpable scrotal hydrocele. Extremities are without edema good. Good pedal pulses. Skin unremarkable except for he had a tick attached to his right lower abdominal wall. This was removed with forceps, grasping by the head and carefully removing it. It was not significantly engorged.  Assessment: Physical examination History of keratoconus History of hyperlipidemia History of gout, with treatment of hyperuricemia Tick right abdominal wall, removed Hydrocele on right History of kidney stones Depression and mood disorder History of vitamin D deficiency  Plan: Tick removal is noted. Represcribed his allopurinol. Check labs. I offered Lyme prophylaxis with 200 mg of doxycycline single dose, he declined this. Cautioned him that he get any fevers, rashes, or flulike illness in the next several weeks he needs to be treated immediately for the risk of heart about spotted fever or Lyme. He understands that.  See the patient instructions as part of the documentation.

## 2014-06-02 LAB — VITAMIN D 25 HYDROXY (VIT D DEFICIENCY, FRACTURES): VIT D 25 HYDROXY: 41 ng/mL (ref 30–100)

## 2014-06-03 ENCOUNTER — Encounter: Payer: Self-pay | Admitting: *Deleted

## 2014-07-03 ENCOUNTER — Other Ambulatory Visit: Payer: Self-pay

## 2014-10-29 ENCOUNTER — Other Ambulatory Visit: Payer: Self-pay | Admitting: Physician Assistant

## 2014-11-24 ENCOUNTER — Ambulatory Visit (INDEPENDENT_AMBULATORY_CARE_PROVIDER_SITE_OTHER): Payer: Medicare Other | Admitting: Emergency Medicine

## 2014-11-24 ENCOUNTER — Ambulatory Visit (INDEPENDENT_AMBULATORY_CARE_PROVIDER_SITE_OTHER): Payer: Medicare Other

## 2014-11-24 VITALS — BP 130/82 | HR 80 | Temp 98.3°F | Resp 16 | Ht 71.0 in | Wt 182.2 lb

## 2014-11-24 DIAGNOSIS — J209 Acute bronchitis, unspecified: Secondary | ICD-10-CM | POA: Diagnosis not present

## 2014-11-24 DIAGNOSIS — Z23 Encounter for immunization: Secondary | ICD-10-CM | POA: Diagnosis not present

## 2014-11-24 DIAGNOSIS — J0140 Acute pansinusitis, unspecified: Secondary | ICD-10-CM

## 2014-11-24 DIAGNOSIS — R05 Cough: Secondary | ICD-10-CM

## 2014-11-24 DIAGNOSIS — R059 Cough, unspecified: Secondary | ICD-10-CM

## 2014-11-24 MED ORDER — HYDROCOD POLST-CPM POLST ER 10-8 MG/5ML PO SUER: 5.0000 mL | Freq: Two times a day (BID) | ORAL | Status: DC

## 2014-11-24 MED ORDER — AMOXICILLIN-POT CLAVULANATE 875-125 MG PO TABS: 1.0000 | ORAL_TABLET | Freq: Two times a day (BID) | ORAL | Status: DC

## 2014-11-24 MED ORDER — PSEUDOEPHEDRINE-GUAIFENESIN ER 60-600 MG PO TB12: 1.0000 | ORAL_TABLET | Freq: Two times a day (BID) | ORAL | Status: DC

## 2014-11-24 NOTE — Patient Instructions (Signed)

## 2014-11-24 NOTE — Progress Notes (Signed)
Subjective:  Patient ID: Carl Cook, male    DOB: Apr 26, 1945  Age: 69 y.o. MRN: [id number]  CC: Cough; Flu Vaccine; and Immunizations   HPI ATTILA MCCARTHY presents  with persistent cough. Said he had a largely nonproductive cough for the last month. He has had postnasal drainage and nasal congestion. He denies any nasal discharge. He has no fever chills wheezing or shortness of breath. No nausea vomiting no improvement with over-the-counter medication. He has no symptoms of reflux. He has no ACE inhibitor dosing  History Wisdom has a past medical history of Arthritis; Anxiety; and Depression.   He has past surgical history that includes Appendectomy; Total hip arthroplasty; Corneal transplant; throat cyst; Eye surgery; and Joint replacement.   His  family history includes Cancer in his maternal grandmother and mother; Heart disease in his father; Stroke in his maternal grandfather; Thyroid disease in his sister.  He   reports that he has quit smoking. He does not have any smokeless tobacco history on file. He reports that he drinks alcohol. He reports that he does not use illicit drugs.  Outpatient Prescriptions Prior to Visit  Medication Sig Dispense Refill  . allopurinol (ZYLOPRIM) 300 MG tablet Take 1 tablet (300 mg total) by mouth daily. 90 tablet 3  . omega-3 acid ethyl esters (LOVAZA) 1 G capsule TAKE 4 CAPSULES BY MOUTH ONCE A DAY. 360 capsule 0  . OVER THE COUNTER MEDICATION OTC Vitamin D 1000 mg daily    . aspirin 81 MG tablet Take 81 mg by mouth daily.    . DULoxetine (CYMBALTA) 60 MG capsule Take 90 mg by mouth daily.     No facility-administered medications prior to visit.    Social History   Social History  . Marital Status: Widowed    Spouse Name: N/A  . Number of Children: N/A  . Years of Education: N/A   Occupational History  . Retired-Physician Asst    Social History Main Topics  . Smoking status: Former Games developer  . Smokeless tobacco: None  . Alcohol  Use: Yes     Comment: 14 drinks  . Drug Use: No  . Sexual Activity: Yes   Other Topics Concern  . None   Social History Narrative   Widow. Significant other. Education: College/other     Review of Systems  Constitutional: Negative for fever, chills and appetite change.  HENT: Positive for congestion and postnasal drip. Negative for ear pain, sinus pressure and sore throat.   Eyes: Negative for pain and redness.  Respiratory: Positive for cough. Negative for shortness of breath and wheezing.   Cardiovascular: Negative for leg swelling.  Gastrointestinal: Negative for nausea, vomiting, abdominal pain, diarrhea, constipation and blood in stool.  Endocrine: Negative for polyuria.  Genitourinary: Negative for dysuria, urgency, frequency and flank pain.  Musculoskeletal: Negative for gait problem.  Skin: Negative for rash.  Neurological: Negative for weakness and headaches.  Psychiatric/Behavioral: Negative for confusion and decreased concentration. The patient is not nervous/anxious.     Objective:  BP 130/82 mmHg  Pulse 80  Temp(Src) 98.3 F (36.8 C) (Oral)  Resp 16  Ht  (1.803 m)  Wt 182 lb 3.2 oz (82.645 kg)  BMI 25.42 kg/m2  SpO2 97%  Physical Exam  Constitutional: He is oriented to person, place, and time. He appears well-developed and well-nourished. No distress.  HENT:  Head: Normocephalic and atraumatic.  Right Ear: External ear normal.  Left Ear: External ear normal.  Nose: Nose normal.  Eyes: Conjunctivae and EOM are normal. Pupils are equal, round, and reactive to light. No scleral icterus.  Neck: Normal range of motion. Neck supple. No tracheal deviation present.  Cardiovascular: Normal rate, regular rhythm and normal heart sounds.   Pulmonary/Chest: Effort normal. No respiratory distress. He has no wheezes. He has no rales.  Abdominal: He exhibits no mass. There is no tenderness. There is no rebound and no guarding.  Musculoskeletal: He exhibits no  edema.  Lymphadenopathy:    He has no cervical adenopathy.  Neurological: He is alert and oriented to person, place, and time. Coordination normal.  Skin: Skin is warm and dry. No rash noted.  Psychiatric: He has a normal mood and affect. His behavior is normal.      Assessment & Plan:   Onalee HuaDavid was seen today for cough, flu vaccine and immunizations.  Diagnoses and all orders for this visit:  Acute pansinusitis, recurrence not specified  Cough -     DG Chest 2 View; Future  Acute bronchitis, unspecified organism  Needs flu shot -     Flu Vaccine QUAD 36+ mos IM  Need for prophylactic vaccination against Streptococcus pneumoniae (pneumococcus)  Other orders -     Pneumococcal conjugate vaccine 13-valent IM -     amoxicillin-clavulanate (AUGMENTIN) 875-125 MG tablet; Take 1 tablet by mouth 2 (two) times daily. -     pseudoephedrine-guaifenesin (MUCINEX D) 60-600 MG 12 hr tablet; Take 1 tablet by mouth every 12 (twelve) hours. -     chlorpheniramine-HYDROcodone (TUSSIONEX PENNKINETIC ER) 10-8 MG/5ML SUER; Take 5 mLs by mouth 2 (two) times daily.   I am having Mr. Fredric MareBailey start on amoxicillin-clavulanate, pseudoephedrine-guaifenesin, and chlorpheniramine-HYDROcodone. I am also having him maintain his aspirin, OVER THE COUNTER MEDICATION, DULoxetine, allopurinol, omega-3 acid ethyl esters, buPROPion, and ARIPiprazole.  Meds ordered this encounter  Medications  . buPROPion (WELLBUTRIN XL) 300 MG 24 hr tablet    Sig: Take 450 mg by mouth daily.  . ARIPiprazole (ABILIFY) 5 MG tablet    Sig: Take 5 mg by mouth daily.  Marland Kitchen. amoxicillin-clavulanate (AUGMENTIN) 875-125 MG tablet    Sig: Take 1 tablet by mouth 2 (two) times daily.    Dispense:  20 tablet    Refill:  0  . pseudoephedrine-guaifenesin (MUCINEX D) 60-600 MG 12 hr tablet    Sig: Take 1 tablet by mouth every 12 (twelve) hours.    Dispense:  18 tablet    Refill:  0  . chlorpheniramine-HYDROcodone (TUSSIONEX PENNKINETIC ER)  10-8 MG/5ML SUER    Sig: Take 5 mLs by mouth 2 (two) times daily.    Dispense:  60 mL    Refill:  0    Appropriate red flag conditions were discussed with the patient as well as actions that should be taken.  Patient expressed his understanding.  Follow-up: Return if symptoms worsen or fail to improve.  Carmelina DaneAnderson, Kischa Altice S, MD  [facility] reading (PRIMARY) by  Dr. Dareen PianoAnderson. Negative .

## 2015-05-04 ENCOUNTER — Other Ambulatory Visit: Payer: Self-pay

## 2015-05-04 MED ORDER — OMEGA-3-ACID ETHYL ESTERS 1 G PO CAPS: ORAL_CAPSULE | ORAL | Status: DC

## 2015-06-13 ENCOUNTER — Other Ambulatory Visit: Payer: Self-pay | Admitting: Family Medicine

## 2015-06-19 ENCOUNTER — Ambulatory Visit (INDEPENDENT_AMBULATORY_CARE_PROVIDER_SITE_OTHER): Payer: Medicare Other | Admitting: Urgent Care

## 2015-06-19 VITALS — BP 122/88 | HR 65 | Temp 98.1°F | Resp 16 | Ht 70.0 in | Wt 184.0 lb

## 2015-06-19 DIAGNOSIS — Z8739 Personal history of other diseases of the musculoskeletal system and connective tissue: Secondary | ICD-10-CM

## 2015-06-19 DIAGNOSIS — E781 Pure hyperglyceridemia: Secondary | ICD-10-CM | POA: Diagnosis not present

## 2015-06-19 DIAGNOSIS — E559 Vitamin D deficiency, unspecified: Secondary | ICD-10-CM | POA: Diagnosis not present

## 2015-06-19 DIAGNOSIS — F329 Major depressive disorder, single episode, unspecified: Secondary | ICD-10-CM | POA: Diagnosis not present

## 2015-06-19 DIAGNOSIS — Z23 Encounter for immunization: Secondary | ICD-10-CM

## 2015-06-19 DIAGNOSIS — F32A Depression, unspecified: Secondary | ICD-10-CM

## 2015-06-19 DIAGNOSIS — Z0000 Encounter for general adult medical examination without abnormal findings: Secondary | ICD-10-CM

## 2015-06-19 DIAGNOSIS — Z8639 Personal history of other endocrine, nutritional and metabolic disease: Secondary | ICD-10-CM

## 2015-06-19 LAB — COMPLETE METABOLIC PANEL WITH GFR
ALT: 24 U/L (ref 9–46)
AST: 31 U/L (ref 10–35)
Albumin: 4.4 g/dL (ref 3.6–5.1)
Alkaline Phosphatase: 80 U/L (ref 40–115)
BUN: 19 mg/dL (ref 7–25)
CHLORIDE: 103 mmol/L (ref 98–110)
CO2: 25 mmol/L (ref 20–31)
CREATININE: 0.92 mg/dL (ref 0.70–1.25)
Calcium: 9.4 mg/dL (ref 8.6–10.3)
GFR, Est Non African American: 85 mL/min (ref >=60)
GLUCOSE: 94 mg/dL (ref 65–99)
Potassium: 4.3 mmol/L (ref 3.5–5.3)
SODIUM: 138 mmol/L (ref 135–146)
Total Bilirubin: 0.9 mg/dL (ref 0.2–1.2)
Total Protein: 7.2 g/dL (ref 6.1–8.1)

## 2015-06-19 LAB — CBC
HEMATOCRIT: 46.1 % (ref 38.5–50.0)
HEMOGLOBIN: 16.4 g/dL (ref 13.2–17.1)
MCH: 33.7 pg — ABNORMAL HIGH (ref 27.0–33.0)
MCHC: 35.6 g/dL (ref 32.0–36.0)
MCV: 94.9 fL (ref 80.0–100.0)
MPV: 9 fL (ref 7.5–12.5)
Platelets: 138 10*3/uL — ABNORMAL LOW (ref 140–400)
RBC: 4.86 MIL/uL (ref 4.20–5.80)
RDW: 13.6 % (ref 11.0–15.0)
WBC: 5.4 10*3/uL (ref 3.8–10.8)

## 2015-06-19 LAB — LIPID PANEL
Cholesterol: 172 mg/dL (ref 125–200)
HDL: 56 mg/dL (ref >=40)
LDL CALC: 101 mg/dL (ref <130)
TRIGLYCERIDES: 74 mg/dL (ref <150)
Total CHOL/HDL Ratio: 3.1 Ratio (ref <=5.0)
VLDL: 15 mg/dL (ref <30)

## 2015-06-19 LAB — URIC ACID: Uric Acid, Serum: 5.8 mg/dL (ref 4.0–8.0)

## 2015-06-19 LAB — TSH: TSH: 1.28 mIU/L (ref 0.40–4.50)

## 2015-06-19 MED ORDER — OMEGA-3-ACID ETHYL ESTERS 1 G PO CAPS: ORAL_CAPSULE | ORAL | Status: AC

## 2015-06-19 NOTE — Addendum Note (Signed)
Addended by: Wallis BambergMANI, Waldo Damian on: 06/19/2015 09:14 AM   Modules accepted: Kipp BroodSmartSet

## 2015-06-19 NOTE — Patient Instructions (Addendum)
Keeping you healthy  Get these tests  Blood pressure- Have your blood pressure checked once a year by your healthcare provider.  Normal blood pressure is 120/80  Weight- Have your body mass index (BMI) calculated to screen for obesity.  BMI is a measure of body fat based on height and weight. You can also calculate your own BMI at [email or web address].com/bmi/.  Cholesterol- Have your cholesterol checked every year.  Diabetes- Have your blood sugar checked regularly if you have high blood pressure, high cholesterol, have a family history of diabetes or if you are overweight.  Screening for Colon Cancer- Colonoscopy starting at age 50.  Screening may begin sooner depending on your family history and other health conditions. Follow up colonoscopy as directed by your Gastroenterologist.  Screening for Prostate Cancer- Both blood work (PSA) and a rectal exam help screen for Prostate Cancer.  Screening begins at age 40 with African-American men and at age 50 with Caucasian men.  Screening may begin sooner depending on your family history.  Take these medicines  Aspirin- One aspirin daily can help prevent Heart disease and Stroke.  Flu shot- Every fall.  Tetanus- Every 10 years.  Zostavax- Once after the age of 60 to prevent Shingles.  Pneumonia shot- Once after the age of 65; if you are younger than 65, ask your healthcare provider if you need a Pneumonia shot.  Take these steps  Don't smoke- If you do smoke, talk to your doctor about quitting.  For tips on how to quit, go to www.smokefree.gov or call 1-800-QUIT-NOW.  Be physically active- Exercise 5 days a week for at least 30 minutes.  If you are not already physically active start slow and gradually work up to 30 minutes of moderate physical activity.  Examples of moderate activity include walking briskly, mowing the yard, dancing, swimming, bicycling, etc.  Eat a healthy diet- Eat a variety of healthy food such as fruits, vegetables, low  fat milk, low fat cheese, yogurt, lean meant, poultry, fish, beans, tofu, etc. For more information go to www.thenutritionsource.org  Drink alcohol in moderation- Limit alcohol intake to less than two drinks a day. Never drink and drive.  Dentist- Brush and floss twice daily; visit your dentist twice a year.  Depression- Your emotional health is as important as your physical health. If you're feeling down, or losing interest in things you would normally enjoy please talk to your healthcare provider.  Eye exam- Visit your eye doctor every year.  Safe sex- If you may be exposed to a sexually transmitted infection, use a condom.  Seat belts- Seat belts can save your life; always wear one.  Smoke/Carbon Monoxide detectors- These detectors need to be installed on the appropriate level of your home.  Replace batteries at least once a year.  Skin cancer- When out in the sun, cover up and use sunscreen 15 SPF or higher.  Violence- If anyone is threatening you, please tell your healthcare provider.  Living Will/ Health care power of attorney- Speak with your healthcare provider and family.    IF you received an x-ray today, you will receive an invoice from [facility]. Please contact [facility] at [phone] with questions or concerns regarding your invoice.   IF you received labwork today, you will receive an invoice from [facility]. Please contact [facility] at [phone] with questions or concerns regarding your invoice.   Our billing staff will not be able to assist you with questions regarding bills from these companies.    You will be contacted with the lab results as soon as they are available. The fastest way to get your results is to activate your My Chart account. Instructions are located on the last page of this paperwork. If you have not heard from us regarding the results in 2 weeks, please contact this office.      

## 2015-06-19 NOTE — Progress Notes (Addendum)
MRN: [id number]  Subjective:   Mr. Carl Cook is a 70 y.o. male presenting for annual physical exam.  Medical care team includes: PCP: None. Vision: Wears contacts, has regular f/u, sees [facility] Maryelizabeth Kauf[facility]. Dental: Gets cleanings every 6 months, last 06/2015. Specialists:   GI - Colonoscopy completed 11/2012. Patient is on 5 year follow up.  Urologist - Gets follow up for hydrocele.   Patient is retired, was with the [employer]. Has significant other, lives with her, has 2 adult children, has good relationships. Declines STI testing. Former smoker, quit in 1973, smoked 1/2 ppd for 2 years. Has 2 drinks of alcohol per day. Tries to eat healthily. Does not exercise.  Gout - Taking allopurinol for flare up prevention. Has taken this for 20-30 years, has not had any recent episodes.  Depression - Dr. Alanson Aly follows patient. Managed with Abilify and Wellbutrin. Had f/u 06/18/2015, had dose adjustments at his follow and his medications list was updated today.  Juandedios has a current medication list which includes the following prescription(s): allopurinol, aripiprazole, aspirin, bupropion, omega-3 acid ethyl esters, and OVER THE COUNTER MEDICATION. He has No Known Allergies.  Dayvian  has a past medical history of Arthritis; Anxiety; and Depression. Also  has past surgical history that includes Appendectomy; Total hip arthroplasty; Corneal transplant; throat cyst; Eye surgery; and Joint replacement.  His family history includes Cancer in his maternal grandmother and mother; Heart disease in his father; Stroke in his maternal grandfather; Thyroid disease in his sister.  Immunizations: TDAP 01/2005, Prevnar 13 11/2014, Zoster 05/2012  Review of Systems  Constitutional: Negative for fever, chills, weight loss, malaise/fatigue and diaphoresis.  HENT: Negative for congestion, ear discharge, ear pain, hearing loss, nosebleeds, sore throat and  tinnitus.   Eyes: Negative for blurred vision, double vision, photophobia, pain, discharge and redness.  Respiratory: Negative for cough, shortness of breath and wheezing.   Cardiovascular: Negative for chest pain, palpitations and leg swelling.  Gastrointestinal: Negative for nausea, vomiting, abdominal pain, diarrhea, constipation and blood in stool.  Genitourinary: Negative for dysuria, urgency, frequency, hematuria and flank pain.       Hydrocele.  Musculoskeletal: Negative for myalgias, back pain and joint pain.  Skin: Negative for itching and rash.  Neurological: Negative for dizziness, tingling, seizures, loss of consciousness, weakness and headaches.  Endo/Heme/Allergies: Negative for polydipsia.  Psychiatric/Behavioral: Positive for depression. Negative for suicidal ideas, hallucinations, memory loss and substance abuse. The patient is not nervous/anxious and does not have insomnia.    Objective:   Vitals: BP 122/88 mmHg  Pulse 65  Temp(Src) 98.1 F (36.7 C) (Oral)  Resp 16  Ht  (1.778 m)  Wt 184 lb (83.462 kg)  BMI 26.40 kg/m2  SpO2 98%  Physical Exam  Constitutional: He is oriented to person, place, and time. He appears well-developed and well-nourished.  HENT:  TM's intact bilaterally, no effusions or erythema. Nasal turbinates pink and moist, nasal passages patent. No sinus tenderness. Oropharynx clear, mucous membranes moist, dentition in good repair.  Eyes: Conjunctivae and EOM are normal. Pupils are equal, round, and reactive to light. Right eye exhibits no discharge. Left eye exhibits no discharge. No scleral icterus.  Neck: Normal range of motion. Neck supple. No thyromegaly present.  Cardiovascular: Normal rate, regular rhythm and intact distal pulses.  Exam reveals no gallop and no friction rub.   No murmur heard. Pulmonary/Chest: No stridor. No respiratory distress. He has no wheezes. He has  no rales.  Abdominal: Soft. Bowel sounds are normal. He  exhibits no distension and no mass. There is no tenderness.  Musculoskeletal: Normal range of motion. He exhibits no edema or tenderness.  Lymphadenopathy:    He has no cervical adenopathy.  Neurological: He is alert and oriented to person, place, and time. He has normal reflexes.  Skin: Skin is warm and dry. No rash noted. No erythema. No pallor.  Psychiatric:  Flat affect. No SI, HI.   Assessment and Plan :   1. Annual physical exam - Very pleasant man, medically stable, labs pending - Discussed healthy lifestyle, diet, exercise, preventative care, vaccinations, and addressed patient's concerns.   2. Vitamin D deficiency - VITAMIN D 25 Hydroxy (Vit-D Deficiency, Fractures) pending  3. Depression - Continue current regimen and f/u with Dr. Jennelle Humanottle  4. Hypertriglyceridemia - Refill of Lovaza  5. History of gout - Uric Acid pending, continue Allopurinol.  6. Need for Tdap vaccination - Tdap vaccine greater than or equal to 7yo IM   Wallis BambergMario Gentri Guardado, PA-C [facility] [phone] 06/19/2015  8:35 AM

## 2015-06-20 ENCOUNTER — Encounter: Payer: Self-pay | Admitting: Urgent Care

## 2015-06-20 LAB — VITAMIN D 25 HYDROXY (VIT D DEFICIENCY, FRACTURES): Vit D, 25-Hydroxy: 50 ng/mL (ref 30–100)

## 2015-07-10 ENCOUNTER — Other Ambulatory Visit: Payer: Self-pay | Admitting: Family Medicine

## 2015-10-09 ENCOUNTER — Other Ambulatory Visit: Payer: Self-pay | Admitting: Physician Assistant

## 2016-01-09 ENCOUNTER — Other Ambulatory Visit: Payer: Self-pay | Admitting: Physician Assistant

## 2016-02-18 DIAGNOSIS — K4090 Unilateral inguinal hernia, without obstruction or gangrene, not specified as recurrent: Secondary | ICD-10-CM

## 2016-04-12 ENCOUNTER — Other Ambulatory Visit: Payer: Self-pay | Admitting: Urgent Care

## 2016-04-14 NOTE — Telephone Encounter (Signed)
06/2015 last ov 

## 2016-05-24 IMAGING — CR DG CHEST 2V
2 series · 2 of 2 positions shown · non-contrast
Comparison: Chest x-ray dated 01/18/2014.

CLINICAL DATA: Shortness of breath.  Cough.

EXAM:
CHEST  2 VIEW

[PA]
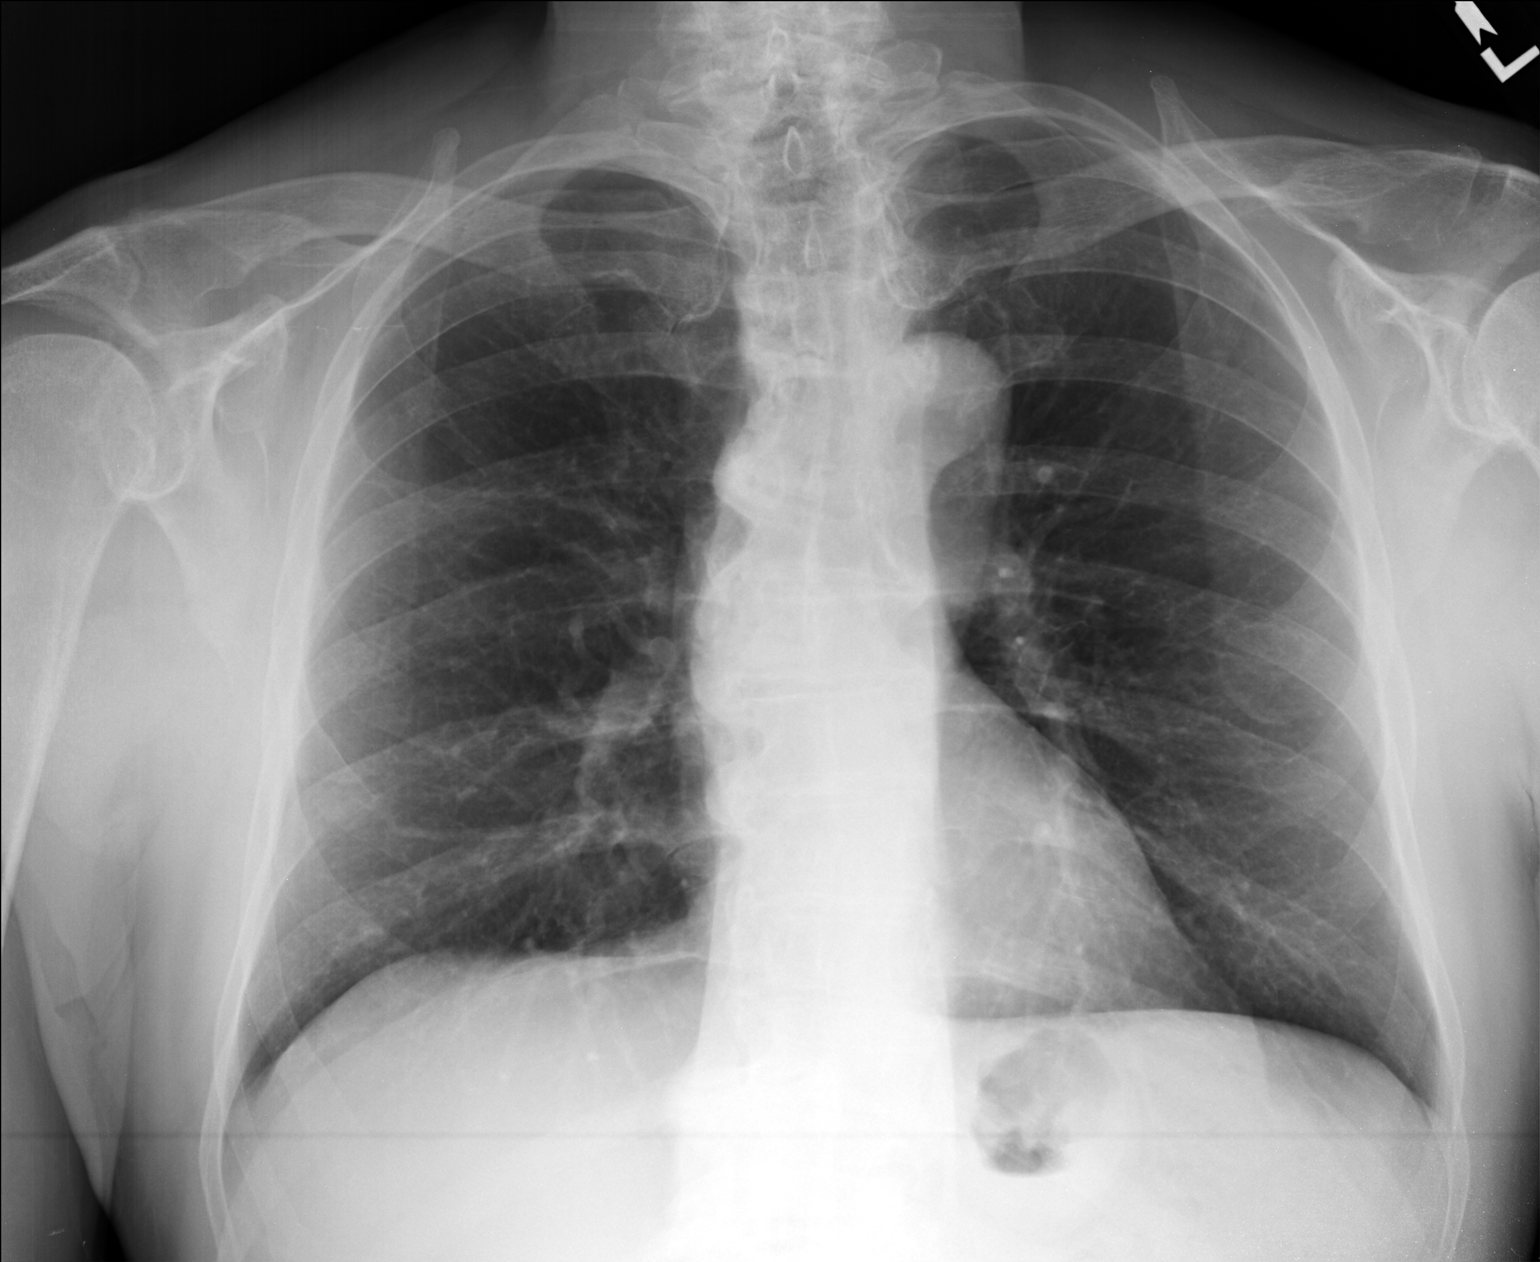

[lateral]
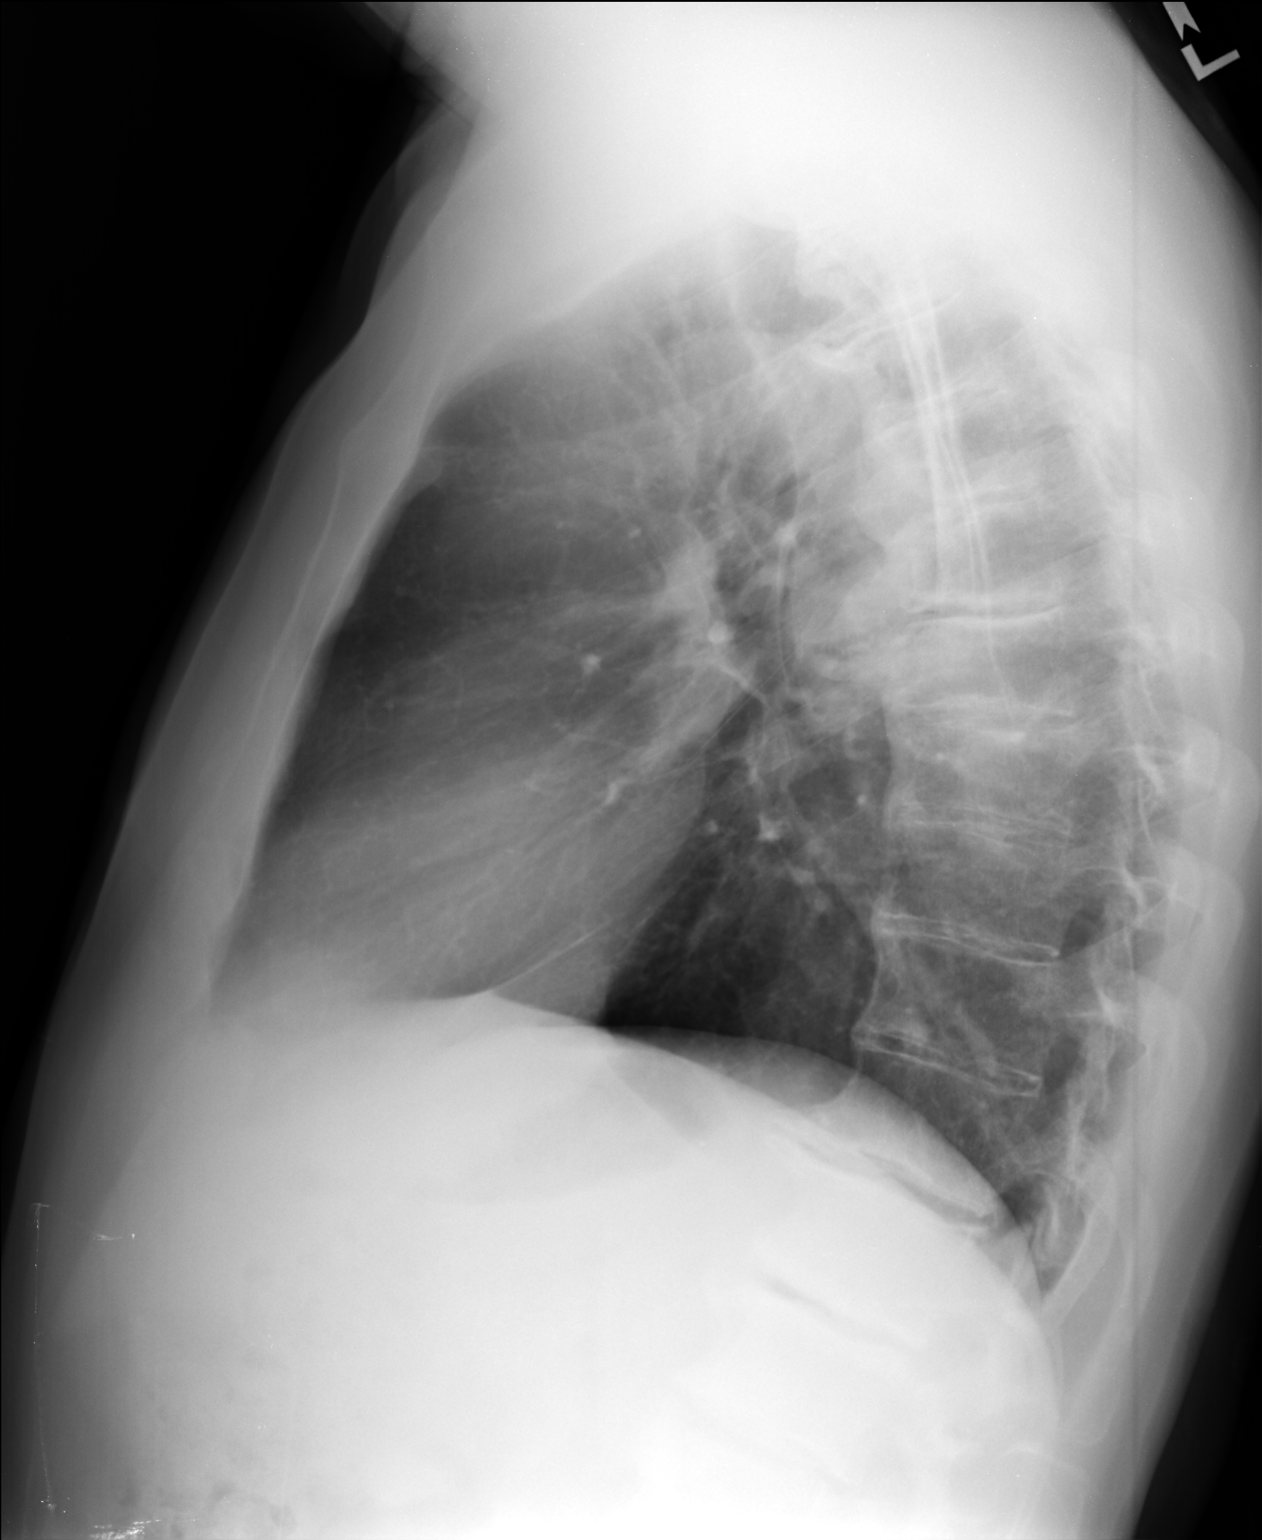

[2 of 2 positions shown; findings below may reference images not displayed]

FINDINGS: Heart size is normal. Overall cardiomediastinal silhouette is stable
in size and configuration. Lungs are clear. Lung volumes are normal.
No pleural effusion. No pneumothorax seen.

Degenerative changes again noted within the thoracic spine, at least
moderate in degree. No acute osseous abnormality.
IMPRESSION: Stable chest x-ray. No evidence of acute cardiopulmonary
abnormality.
